# Patient Record
Sex: Male | Born: 1955 | Race: Black or African American | Hispanic: No | Marital: Married | Smoking: Never smoker
Health system: Southern US, Community
[De-identification: ages and names within clinical notes are randomized; demographics above are authoritative.]

## PROBLEM LIST (undated history)

## (undated) ENCOUNTER — Emergency Department (HOSPITAL_BASED_OUTPATIENT_CLINIC_OR_DEPARTMENT_OTHER): Admission: EM | Payer: Managed Care, Other (non HMO) | Source: Home / Self Care

## (undated) DIAGNOSIS — K4090 Unilateral inguinal hernia, without obstruction or gangrene, not specified as recurrent: Secondary | ICD-10-CM

## (undated) DIAGNOSIS — I10 Essential (primary) hypertension: Secondary | ICD-10-CM

## (undated) HISTORY — DX: Essential (primary) hypertension: I10

## (undated) HISTORY — DX: Unilateral inguinal hernia, without obstruction or gangrene, not specified as recurrent: K40.90

---
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
## 2006-08-30 ENCOUNTER — Encounter: Admission: RE | Admit: 2006-08-30 | Discharge: 2006-08-30 | Payer: Self-pay | Admitting: Internal Medicine

## 2009-03-16 ENCOUNTER — Encounter: Admission: RE | Admit: 2009-03-16 | Discharge: 2009-03-16 | Payer: Self-pay | Admitting: Internal Medicine

## 2009-05-28 ENCOUNTER — Emergency Department (HOSPITAL_BASED_OUTPATIENT_CLINIC_OR_DEPARTMENT_OTHER): Admission: EM | Admit: 2009-05-28 | Discharge: 2009-05-28 | Payer: Self-pay | Admitting: Emergency Medicine

## 2010-08-14 ENCOUNTER — Encounter: Payer: Self-pay | Admitting: Internal Medicine

## 2011-12-06 ENCOUNTER — Other Ambulatory Visit: Payer: Self-pay | Admitting: Internal Medicine

## 2011-12-06 ENCOUNTER — Ambulatory Visit
Admission: RE | Admit: 2011-12-06 | Discharge: 2011-12-06 | Disposition: A | Discharge status: Home / Self Care | Payer: Managed Care, Other (non HMO) | Source: Ambulatory Visit | Attending: Internal Medicine | Admitting: Internal Medicine

## 2011-12-06 DIAGNOSIS — R0789 Other chest pain: Secondary | ICD-10-CM

## 2012-07-10 ENCOUNTER — Encounter: Payer: Self-pay | Admitting: Interventional Cardiology

## 2013-02-03 ENCOUNTER — Encounter: Payer: Self-pay | Admitting: Interventional Cardiology

## 2013-02-11 ENCOUNTER — Encounter (INDEPENDENT_AMBULATORY_CARE_PROVIDER_SITE_OTHER): Payer: Self-pay

## 2013-03-12 ENCOUNTER — Ambulatory Visit (INDEPENDENT_AMBULATORY_CARE_PROVIDER_SITE_OTHER): Payer: Commercial Indemnity | Admitting: Surgery

## 2013-03-12 ENCOUNTER — Encounter (INDEPENDENT_AMBULATORY_CARE_PROVIDER_SITE_OTHER): Payer: Self-pay | Admitting: Surgery

## 2013-03-12 VITALS — BP 124/78 | HR 64 | Temp 97.8°F | Resp 14 | Ht 69.0 in | Wt 179.0 lb

## 2013-03-12 DIAGNOSIS — K4090 Unilateral inguinal hernia, without obstruction or gangrene, not specified as recurrent: Secondary | ICD-10-CM | POA: Insufficient documentation

## 2013-03-12 NOTE — Progress Notes (Signed)
Re:   Devin Lynn DOB:   May 04, 1956 MRN:   [id number]  ASSESSMENT AND PLAN: 1.  Left inguinal hernia  I discussed the indications and complications of hernia surgery with the patient.  I discussed both the laparoscopic and open approach to hernia repair..  The potential risks of hernia surgery include, but are not limited to, bleeding, infection, open surgery, nerve injury, and recurrence of the hernia.  I provided the patient literature about hernia surgery.  The hernia is a little abnormal location - so to think about a femoral hernia.  His hernia is very small and almost asymptomatic.  He could watch it or go ahead and get it fixed.  He wants to see me back in 6 months to check the hernia.  If he decides to get it fixed, he will call back in the mean time.  He knows if it enlarges or causes increasing pain, he should contact us.  2.  Hypertension  Chief Complaint  Patient presents with  . New Evaluation    eval [facility]   REFERRING PHYSICIAN: MOREIRA,ROY, MD  HISTORY OF PRESENT ILLNESS: Devin Lynn is a 57 y.o. (DOB: 07-Mar-1956)  AA  male whose primary care physician is MOREIRA,ROY, MD and comes to me today for a left inguinal hernia.  The patient has noticed a bulge in his left groin for about 7 weeks.  He says that he notices a "poofy" sensation about once a week, so the hernia is barely bothering him.  He says that at time, he has "flutters beneath his umbilicus".   He has had no prior abdominal surgery or hernia repair.  He has no history of stomach disease.  No history of liver disease.  No history of gall bladder disease.  No history of pancreas disease.  No history of colon disease.  He had a colonoscopy about 2006 in [place] (unknown GI doctor).   Past Medical History  Diagnosis Date  . Hypertension     Past Surgical History  Procedure Laterality Date  . Circumcision  1974    Current Outpatient Prescriptions  Medication Sig Dispense Refill  . aspirin 81 MG tablet Take 81 mg  by mouth daily.      Marland Kitchen lisinopril (PRINIVIL,ZESTRIL) 10 MG tablet Take 10 mg by mouth daily.       No current facility-administered medications for this visit.     Not on File  REVIEW OF SYSTEMS: Skin:  No history of rash.  No history of abnormal moles. Infection:  No history of hepatitis or HIV.  No history of MRSA. Neurologic:  No history of stroke.  No history of seizure.  No history of headaches. Cardiac:  Hypertension x 1 year. Pulmonary:  Does not smoke cigarettes.  No asthma or bronchitis.  No OSA/CPAP.  Endocrine:  No diabetes. No thyroid disease. Gastrointestinal:  See HPI. Urologic:  No history of kidney stones.  No history of bladder infections. Musculoskeletal:  No history of joint or back disease. Hematologic:  No bleeding disorder.  No history of anemia.  Not anticoagulated. Psycho-social:  The patient is oriented.   The patient has no obvious psychologic or social impairment to understanding our conversation and plan.  SOCIAL and FAMILY HISTORY: Married. But he did not want his wife in the room.  He said that she would say go ahead and get it fixed. 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  PHYSICAL EXAM: BP 124/78  Pulse 64  Temp(Src) 97.8 F (36.6 C) (Temporal)  Resp 14  Ht 5\' 9"  (1.753 m)  Wt 179 lb (81.194 kg)  BMI 26.42 kg/m2  General: AA M who is alert and generally healthy appearing.  He has a beard. HEENT: Normal. Pupils equal. Neck: Supple. No mass.  No thyroid mass. Lymph Nodes:  No supraclavicular or cervical nodes. Lungs: Clear to auscultation and symmetric breath sounds. Heart:  RRR. No murmur or rub.  Abdomen: Soft. No mass. No tenderness. Normal bowel sounds.  No abdominal scars.  In a standing position, I can feel a small hernia in his right groin.  It is a little hard for me to tell whether this is inguinal or femoral in location, though I favor inguinal. The hernia reduces when he lays down and I have trouble  feeling it.   I do not feel a hernia on the right side. Rectal: Not done. Extremities:  Good strength and ROM  in upper and lower extremities. Neurologic:  Grossly intact to motor and sensory function. Psychiatric: Has normal mood and affect. Behavior is normal.   DATA REVIEWED: Notes from Dr. Ludwig Clarks.  Ovidio Kin, MD,  [facility] [address] [place]  [address]   [place], [place]    [address] Phone:  [phone] FAX:  [phone]

## 2013-04-10 ENCOUNTER — Encounter: Payer: Self-pay | Admitting: Interventional Cardiology

## 2013-04-10 ENCOUNTER — Encounter: Payer: Self-pay | Admitting: *Deleted

## 2013-04-10 DIAGNOSIS — I10 Essential (primary) hypertension: Secondary | ICD-10-CM | POA: Insufficient documentation

## 2013-04-22 ENCOUNTER — Ambulatory Visit (INDEPENDENT_AMBULATORY_CARE_PROVIDER_SITE_OTHER): Payer: Managed Care, Other (non HMO) | Admitting: Interventional Cardiology

## 2013-04-22 ENCOUNTER — Encounter: Payer: Self-pay | Admitting: Interventional Cardiology

## 2013-04-22 VITALS — BP 120/90 | HR 80 | Ht 69.0 in | Wt 171.0 lb

## 2013-04-22 DIAGNOSIS — R079 Chest pain, unspecified: Secondary | ICD-10-CM

## 2013-04-22 DIAGNOSIS — I10 Essential (primary) hypertension: Secondary | ICD-10-CM

## 2013-04-22 NOTE — Patient Instructions (Addendum)
NO CHANGES WERE MADE TODAY  Your physician recommends that you schedule a follow-up appointment in: AS NEEDED

## 2013-04-22 NOTE — Progress Notes (Signed)
  [address] [place], [place]  [address] Phone: [phone] Fax:  [phone]  Date:  04/22/2013   ID:  Haward Pope, DOB [birth year], MRN [id number]  PCP:  Ralene Ok, MD     History of Present Illness: Devin Lynn is a 57 y.o. male is here for evaluation of chest discomfort and left leg pain occurred. On one occasion 4 weeks ago. He was running when this occurred. It lasted less than 5 seconds. It frightened him. He has not had palpitations, orthopnea, PND, chest tightness, lower extremity edema, or other cardiac complaints. He is very active and has no limitations. It has not recurred since episode several weeks ago.   Wt Readings from Last 3 Encounters:  03/12/13 179 lb (81.194 kg)     Past Medical History  Diagnosis Date  . Hypertension   . Inguinal hernia, left     Current Outpatient Prescriptions  Medication Sig Dispense Refill  . aspirin 81 MG tablet Take 81 mg by mouth daily.      Marland Kitchen lisinopril (PRINIVIL,ZESTRIL) 10 MG tablet Take 10 mg by mouth daily.       No current facility-administered medications for this visit.    Allergies:   Not on File  Social History:  The patient  reports that he has never smoked. He has never used smokeless tobacco. He reports that he does not drink alcohol or use illicit drugs.   ROS:  Please see the history of present illness.   There are no neurological complaints. He denies wheezing, dyspnea, and asthma. He denies lower extremity swelling. No history of GI bleeding, or intestinal problems. There is no arthritis or connective tissue disorder.   All other systems reviewed and negative.   PHYSICAL EXAM: VS: BP 120/90  Pulse 80  Ht 5\' 9"  (1.753 m)  Wt 171 lb (77.565 kg)  BMI 25.24 kg/m2 Well nourished, well developed, in no acute distress HEENT: normal Neck: no JVD or carotid bruits are noted. Cardiac:  normal S1, S2; RRR; no murmur. Soft S4 gallop is audible.  Lungs:  clear to auscultation bilaterally, no wheezing,  rhonchi or rales Abd: soft, nontender, no hepatomegaly. No bruits heard.  Ext: no edema. Femoral pulses are 2+ and symmetric as are the popliteal and posterior tibial.  Skin: warm and dry Neuro:  CNs 2-12 intact, no focal abnormalities noted  EKG:   left axis deviation. Normal sinus rhythm, no an EKG performed to Dr. Jacqulyn Bath office on 02/03/13 to      ASSESSMENT AND PLAN:  1. Chest pain, felt to represent nonischemic discomfort. Likely musculoskeletal or neurogenic 2. Left leg pain, transient, also felt to be musculoskeletal and neurogenic. No evidence of PVD 3.  Hypertension under excellent control.  The patient's symptoms do not reflect cardiovascular disease. I recommended continued exercise, weight maintenance, close followup of lipids, which are under good condition at this time. I do not feel any functional testing is indicated at this time. I reassured the patient. I will be happy to see him again in the future if any issues arise.   Signed, Alanda Amass Leia Alf, MD 04/22/2013 9:07 AM

## 2013-07-26 ENCOUNTER — Ambulatory Visit (HOSPITAL_BASED_OUTPATIENT_CLINIC_OR_DEPARTMENT_OTHER)
Admission: EM | Admit: 2013-07-26 | Discharge: 2013-07-26 | Disposition: A | Discharge status: Home / Self Care | Payer: Managed Care, Other (non HMO) | Attending: Emergency Medicine | Admitting: Emergency Medicine

## 2013-07-26 ENCOUNTER — Emergency Department (HOSPITAL_BASED_OUTPATIENT_CLINIC_OR_DEPARTMENT_OTHER)
Admission: EM | Admit: 2013-07-26 | Discharge: 2013-07-26 | Disposition: A | Discharge status: Home / Self Care | Payer: Managed Care, Other (non HMO) | Attending: Emergency Medicine | Admitting: Emergency Medicine

## 2013-07-26 ENCOUNTER — Encounter (HOSPITAL_BASED_OUTPATIENT_CLINIC_OR_DEPARTMENT_OTHER): Payer: Self-pay | Admitting: Emergency Medicine

## 2013-07-26 ENCOUNTER — Emergency Department (HOSPITAL_BASED_OUTPATIENT_CLINIC_OR_DEPARTMENT_OTHER): Payer: Managed Care, Other (non HMO)

## 2013-07-26 ENCOUNTER — Telehealth (HOSPITAL_BASED_OUTPATIENT_CLINIC_OR_DEPARTMENT_OTHER): Payer: Self-pay | Admitting: Emergency Medicine

## 2013-07-26 DIAGNOSIS — R0789 Other chest pain: Secondary | ICD-10-CM | POA: Insufficient documentation

## 2013-07-26 DIAGNOSIS — I10 Essential (primary) hypertension: Secondary | ICD-10-CM | POA: Insufficient documentation

## 2013-07-26 DIAGNOSIS — J209 Acute bronchitis, unspecified: Secondary | ICD-10-CM | POA: Insufficient documentation

## 2013-07-26 DIAGNOSIS — Z7982 Long term (current) use of aspirin: Secondary | ICD-10-CM | POA: Insufficient documentation

## 2013-07-26 DIAGNOSIS — J189 Pneumonia, unspecified organism: Secondary | ICD-10-CM

## 2013-07-26 DIAGNOSIS — J3489 Other specified disorders of nose and nasal sinuses: Secondary | ICD-10-CM | POA: Insufficient documentation

## 2013-07-26 DIAGNOSIS — R05 Cough: Secondary | ICD-10-CM | POA: Insufficient documentation

## 2013-07-26 DIAGNOSIS — R9389 Abnormal findings on diagnostic imaging of other specified body structures: Secondary | ICD-10-CM | POA: Insufficient documentation

## 2013-07-26 DIAGNOSIS — K4090 Unilateral inguinal hernia, without obstruction or gangrene, not specified as recurrent: Secondary | ICD-10-CM | POA: Insufficient documentation

## 2013-07-26 DIAGNOSIS — R059 Cough, unspecified: Secondary | ICD-10-CM | POA: Insufficient documentation

## 2013-07-26 DIAGNOSIS — Z8719 Personal history of other diseases of the digestive system: Secondary | ICD-10-CM | POA: Insufficient documentation

## 2013-07-26 DIAGNOSIS — Z79899 Other long term (current) drug therapy: Secondary | ICD-10-CM | POA: Insufficient documentation

## 2013-07-26 LAB — BASIC METABOLIC PANEL
BUN: 10 mg/dL (ref 6–23)
CO2: 29 meq/L (ref 19–32)
CREATININE: 1 mg/dL (ref 0.50–1.35)
Calcium: 9.4 mg/dL (ref 8.4–10.5)
Chloride: 101 mEq/L (ref 96–112)
GFR calc Af Amer: >90 mL/min (ref >90)
GFR, EST NON AFRICAN AMERICAN: 81 mL/min — AB (ref >90)
GLUCOSE: 118 mg/dL — AB (ref 70–99)
Potassium: 4 mEq/L (ref 3.7–5.3)
SODIUM: 141 meq/L (ref 137–147)

## 2013-07-26 MED ORDER — ALBUTEROL SULFATE HFA 108 (90 BASE) MCG/ACT IN AERS
2.0000 | INHALATION_SPRAY | RESPIRATORY_TRACT | Status: DC | PRN
  Filled 2013-07-26: qty 6.7

## 2013-07-26 MED ORDER — SODIUM CHLORIDE 0.9 % IV BOLUS (SEPSIS)
1000.0000 mL | Freq: Once | INTRAVENOUS | Status: AC
  Administered 2013-07-26: 1000 mL via INTRAVENOUS

## 2013-07-26 MED ORDER — LEVOFLOXACIN 500 MG PO TABS
500.0000 mg | ORAL_TABLET | Freq: Once | ORAL | Status: AC
  Administered 2013-07-26: 500 mg via ORAL
  Filled 2013-07-26: qty 1

## 2013-07-26 MED ORDER — HYDROCOD POLST-CHLORPHEN POLST 10-8 MG/5ML PO LQCR
5.0000 mL | Freq: Once | ORAL | Status: AC
  Administered 2013-07-26: 5 mL via ORAL
  Filled 2013-07-26: qty 5

## 2013-07-26 MED ORDER — IOHEXOL 350 MG/ML SOLN
100.0000 mL | Freq: Once | INTRAVENOUS | Status: AC | PRN
  Administered 2013-07-26: 100 mL via INTRAVENOUS

## 2013-07-26 MED ORDER — LEVOFLOXACIN 500 MG PO TABS: 500.0000 mg | ORAL_TABLET | Freq: Every day | ORAL | Status: DC

## 2013-07-26 MED ORDER — HYDROCOD POLST-CHLORPHEN POLST 10-8 MG/5ML PO LQCR: 5.0000 mL | Freq: Two times a day (BID) | ORAL | Status: DC | PRN

## 2013-07-26 NOTE — Discharge Instructions (Signed)
Acute Bronchitis Bronchitis is inflammation of the airways that extend from the windpipe into the lungs (bronchi). The inflammation often causes mucus to develop. This leads to a cough, which is the most common symptom of bronchitis.  In acute bronchitis, the condition usually develops suddenly and goes away over time, usually in a couple weeks. Smoking, allergies, and asthma can make bronchitis worse. Repeated episodes of bronchitis may cause further lung problems.  CAUSES Acute bronchitis is most often caused by the same virus that causes a cold. The virus can spread from person to person (contagious).  SIGNS AND SYMPTOMS   Cough.   Fever.   Coughing up mucus.   Body aches.   Chest congestion.   Chills.   Shortness of breath.   Sore throat.  DIAGNOSIS  Acute bronchitis is usually diagnosed through a physical exam. Tests, such as chest X-rays, are sometimes done to rule out other conditions.  TREATMENT  Acute bronchitis usually goes away in a couple weeks. Often times, no medical treatment is necessary. Medicines are sometimes given for relief of fever or cough. Antibiotics are usually not needed but may be prescribed in certain situations. In some cases, an inhaler may be recommended to help reduce shortness of breath and control the cough. A cool mist vaporizer may also be used to help thin bronchial secretions and make it easier to clear the chest.  HOME CARE INSTRUCTIONS  Get plenty of rest.   Drink enough fluids to keep your urine clear or pale yellow (unless you have a medical condition that requires fluid restriction). Increasing fluids may help thin your secretions and will prevent dehydration.   Only take over-the-counter or prescription medicines as directed by your health care provider.   Avoid smoking and secondhand smoke. Exposure to cigarette smoke or irritating chemicals will make bronchitis worse. If you are a smoker, consider using nicotine gum or skin  patches to help control withdrawal symptoms. Quitting smoking will help your lungs heal faster.   Reduce the chances of another bout of acute bronchitis by washing your hands frequently, avoiding people with cold symptoms, and trying not to touch your hands to your mouth, nose, or eyes.   Follow up with your health care provider as directed.  SEEK MEDICAL CARE IF: Your symptoms do not improve after 1 week of treatment.  SEEK IMMEDIATE MEDICAL CARE IF:  You develop an increased fever or chills.   You have chest pain.   You have severe shortness of breath.  You have bloody sputum.   You develop dehydration.  You develop fainting.  You develop repeated vomiting.  You develop a severe headache. MAKE SURE YOU:   Understand these instructions.  Will watch your condition.  Will get help right away if you are not doing well or get worse. Document Released: 08/16/2004 Document Revised: 03/11/2013 Document Reviewed: 12/30/2012 Exit[employer] Patient Information 2014 [employer].  

## 2013-07-26 NOTE — Patient Instructions (Signed)
Patient instructed in use of Albuterol MDI with spacer.  Patient with good understanding and return explanation of use.  MD's ordered dosing instructions also given to patient.  Patient deferred using/demonstrating use at this time, states he is comfortable in its use.  Patient had no further questions. 

## 2013-07-26 NOTE — ED Notes (Signed)
Cough x9 days.  Given Cefdinir 31946m,g bid by pmd.  Finished it today.  Pt continues to cough and feel tight in chest.  Has not had cxr.

## 2013-07-26 NOTE — Telephone Encounter (Signed)
attempt to contact regarding follow-up from ED visit. Left message to call flow managers #

## 2013-07-26 NOTE — Discharge Instructions (Signed)
As discussed, it is important that you follow up as soon as possible with your physician for continued management of your condition. ° °If you develop any new, or concerning changes in your condition, please return to the emergency department immediately. ° °

## 2013-07-26 NOTE — ED Provider Notes (Signed)
CSN: [id number]     Arrival date & time 07/26/13  0344 History   First MD Initiated Contact with Patient 07/26/13 0413     Chief Complaint  Patient presents with  . Cough   (Consider location/radiation/quality/duration/timing/severity/associated sxs/prior Treatment) HPI This is a 58 year old male with a 9 day history of cough. He was seen by his primary care physician and placed on a course of Cefdinir which he finished yesterday evening. He has also been taking Mucinex. Despite this treatment he has a persistent cough. The cough is associated with some tightness only when he coughs. He does not have chest pain. He has not had shortness of breath. He does not have dyspnea on exertion. He had low-grade fevers early in the course of his illness. He is having some nasal and sinus congestion. He denies other symptoms. He stopped taking his lisinopril when this illness began.  Past Medical History  Diagnosis Date  . Hypertension   . Inguinal hernia, left    Past Surgical History  Procedure Laterality Date  . Circumcision  1974   Family History  Problem Relation Age of Onset  . Diabetes Mellitus II Mother   . Diabetes Mellitus II Father   . Diabetes Mellitus II Sister   . HIV/AIDS Brother    History  Substance Use Topics  . Smoking status: Never Smoker   . Smokeless tobacco: Never Used  . Alcohol Use: No    Review of Systems  All other systems reviewed and are negative.    Allergies  Review of patient's allergies indicates no known allergies.  Home Medications   Current Outpatient Rx  Name  Route  Sig  Dispense  Refill  . aspirin 81 MG tablet   Oral   Take 81 mg by mouth daily.         . chlorpheniramine-HYDROcodone (TUSSIONEX PENNKINETIC ER) 10-8 MG/5ML LQCR   Oral   Take 5 mLs by mouth every 12 (twelve) hours as needed for cough.   70 mL   0   . lisinopril (PRINIVIL,ZESTRIL) 10 MG tablet   Oral   Take 10 mg by mouth daily.          BP 131/81  Pulse 68   Temp(Src) 98.2 F (36.8 C) (Oral)  Resp 16  Ht 5\' 9"  (1.753 m)  Wt 172 lb 8 oz (78.245 kg)  BMI 25.46 kg/m2  SpO2 98%  Physical Exam General: Well-developed, well-nourished male in no acute distress; appearance consistent with age of record HENT: normocephalic; atraumatic Eyes: pupils equal, round and reactive to light; extraocular muscles intact; arcus senilis bilaterally Neck: supple Heart: regular rate and rhythm; no murmurs, rubs or gallops Lungs: clear to auscultation bilaterally; dry cough Abdomen: soft; nondistended Extremities: No deformity; full range of motion Neurologic: Awake, alert and oriented; motor function intact in all extremities and symmetric; no facial droop Skin: Warm and dry Psychiatric: Normal mood and affect    ED Course  Procedures (including critical care time)    MDM  Nursing notes and vitals signs, including pulse oximetry, reviewed.  Summary of this visit's results, reviewed by myself:  Imaging Studies: Dg Chest 2 View  07/26/2013   CLINICAL DATA:  Cough and chest tightness  EXAM: CHEST  2 VIEW  COMPARISON:  Prior radiograph from 12/06/2011  FINDINGS: The cardiac and mediastinal silhouettes are within normal limits.  Lungs are normally inflated. There is an irregular opacity measuring approximately 3.1 cm located within the central right lung. There is suggestive  of a of possible central cavitation. No internal air-fluid level seen. This is not seen on prior radiograph from 12/06/2011.  No other focal infiltrate identified. No pulmonary edema or pleural effusion. No pneumothorax.  Visualized soft tissues and osseous structures are within normal limits.  IMPRESSION: 3.1 cm opacity within the mid right lung with suggestion of central cavitation. This finding is new as compared to most recent chest radiograph from 12/06/2011. Further evaluation with contrast-enhanced CT is recommended.   Electronically Signed   By: Rise MuBenjamin  McClintock M.D.   On:  07/26/2013 05:31   5:54 AM Due to significant delay in getting films read by the patient was discharged before the final x-ray reading was available from radiologist. Given the findings of the flow manager was contacted and will call the patient advising him of the chest x-ray findings and the need to have a followup CT. He may either follow up back in the ED or follow up through his primary care physician the    Hanley SeamenJohn L Tereka Thorley, MD 07/26/13 [phone]

## 2013-07-26 NOTE — ED Notes (Signed)
MD at bedside. 

## 2013-07-26 NOTE — ED Notes (Signed)
Patient was told to come back to ED for Ct-Scan of chest

## 2013-07-26 NOTE — ED Notes (Signed)
MD at bedside discussing test results and plan of care for dispo.  

## 2013-07-26 NOTE — ED Provider Notes (Signed)
CSN: [id number]     Arrival date & time 07/26/13  1459 History  This chart was scribed for Gerhard Munchobert Andon Villard, MD by Leone PayorSonum Patel, ED Scribe. This patient was seen in room MH07/MH07 and the patient's care was started 4:16 PM.    Chief Complaint  Patient presents with  . Follow-up    The history is provided by the patient. No language interpreter was used.    HPI Comments: Devin Lynn is a 58 y.o. male who presents to the Emergency Department requesting follow up for a visit from yesterday. He was seen yesterday for the same symptoms and had a CXR performed at that time. Pt states he was called and asked to return due to abnormalities on the CXR reading. He reports having 9 days of a cough with associated chest tightness. He also reports having rhinorrhea and fever when his symptoms initially began. He states the cough has been persistent and has not changed with taking Mucinex and antibiotics prescribed by his PCP.  He also reports having a hoarse voice that began about 4 days ago. He describes this cough as barking.   Past Medical History  Diagnosis Date  . Hypertension   . Inguinal hernia, left    Past Surgical History  Procedure Laterality Date  . Circumcision  1974   Family History  Problem Relation Age of Onset  . Diabetes Mellitus II Mother   . Diabetes Mellitus II Father   . Diabetes Mellitus II Sister   . HIV/AIDS Brother    History  Substance Use Topics  . Smoking status: Never Smoker   . Smokeless tobacco: Never Used  . Alcohol Use: No    Review of Systems  Constitutional:       Per HPI, otherwise negative  HENT: Positive for rhinorrhea and voice change.        Per HPI, otherwise negative  Respiratory: Positive for cough and chest tightness.        Per HPI, otherwise negative  Cardiovascular:       Per HPI, otherwise negative  Gastrointestinal: Negative for vomiting.  Endocrine:       Negative aside from HPI  Genitourinary:       Neg aside from HPI    Musculoskeletal:       Per HPI, otherwise negative  Skin: Negative.   Neurological: Negative for syncope.    Allergies  Review of patient's allergies indicates no known allergies.  Home Medications   Current Outpatient Rx  Name  Route  Sig  Dispense  Refill  . aspirin 81 MG tablet   Oral   Take 81 mg by mouth daily.         . chlorpheniramine-HYDROcodone (TUSSIONEX PENNKINETIC ER) 10-8 MG/5ML LQCR   Oral   Take 5 mLs by mouth every 12 (twelve) hours as needed for cough.   70 mL   0   . lisinopril (PRINIVIL,ZESTRIL) 10 MG tablet   Oral   Take 10 mg by mouth daily.          BP 141/78  Pulse 84  Temp(Src) 99.3 F (37.4 C) (Oral)  Wt 172 lb (78.019 kg)  SpO2 97% Physical Exam  Nursing note and vitals reviewed. Constitutional: He is oriented to person, place, and time. He appears well-developed. No distress.  HENT:  Head: Normocephalic and atraumatic.  Eyes: Conjunctivae and EOM are normal.  Cardiovascular: Normal rate, regular rhythm and normal heart sounds.   Pulmonary/Chest: Effort normal. No stridor. No respiratory distress.  Abdominal: He exhibits no distension.  Musculoskeletal: He exhibits no edema.  Neurological: He is alert and oriented to person, place, and time.  Skin: Skin is warm and dry.  Psychiatric: He has a normal mood and affect.    ED Course  Procedures   DIAGNOSTIC STUDIES: Oxygen Saturation is 97% on RA, adequate by my interpretation.    COORDINATION OF CARE: 4:47 PM Will order chest CT and BMP. Discussed treatment plan with pt at bedside and pt agreed to plan.  5:57 PM Discussed pneumonia findings on the chest CT scan.      Labs Review Labs Reviewed  BASIC METABOLIC PANEL - Abnormal; Notable for the following:    Glucose, Bld 118 (*)    GFR calc non Af Amer 81 (*)    All other components within normal limits   Imaging Review Dg Chest 2 View  07/26/2013   CLINICAL DATA:  Cough and chest tightness  EXAM: CHEST  2 VIEW   COMPARISON:  Prior radiograph from 12/06/2011  FINDINGS: The cardiac and mediastinal silhouettes are within normal limits.  Lungs are normally inflated. There is an irregular opacity measuring approximately 3.1 cm located within the central right lung. There is suggestive of a of possible central cavitation. No internal air-fluid level seen. This is not seen on prior radiograph from 12/06/2011.  No other focal infiltrate identified. No pulmonary edema or pleural effusion. No pneumothorax.  Visualized soft tissues and osseous structures are within normal limits.  IMPRESSION: 3.1 cm opacity within the mid right lung with suggestion of central cavitation. This finding is new as compared to most recent chest radiograph from 12/06/2011. Further evaluation with contrast-enhanced CT is recommended.   Electronically Signed   By: Rise Mu M.D.   On: 07/26/2013 05:31   Ct Chest W Contrast  07/26/2013   CLINICAL DATA:  Question cavitary lesion on recent chest x-ray.  EXAM: CT CHEST WITH CONTRAST  TECHNIQUE: Multidetector CT imaging of the chest was performed during intravenous contrast administration.  CONTRAST:  OMNIPAQUE IOHEXOL 350 MG/ML SOLN  COMPARISON:  Chest x-ray from today and 12/06/2011  FINDINGS: Lungs are adequately inflated demonstrate a patchy bilateral multilobar nodular airspace process without cavitation. Findings likely due to infection. There is no evidence of effusion. Heart is normal in size. There is no mediastinal, hilar or axillary adenopathy. Remaining mediastinal structures are within normal.  Images through the upper abdomen are unremarkable. Remainder the exam is unremarkable.  IMPRESSION: Patchy multilobar bilateral nodular airspace process without cavitation likely a multilobar pneumonia.   Electronically Signed   By: Elberta Fortis M.D.   On: 07/26/2013 17:18    EKG Interpretation   None       MDM  Patient presents after initial evaluation earlier today with truncated  a full evaluation secondary to radiographic delays, now for completion of that evaluation.  On exam patient is awake, alert, afebrile, he dynamically stable.  The patient's CT scan, which I reviewed with him and his wife in the room demonstrates multilobar pneumonia.  Patient is stable for discharge with outpatient followup.  Gerhard Munch, MD 07/26/13 [phone]

## 2013-07-26 NOTE — ED Notes (Signed)
Patient instructed in use of Albuterol MDI with spacer.  Patient with good understanding and return explanation of use.  MD's ordered dosing instructions also given to patient.  Patient deferred using/demonstrating use at this time, states he is comfortable in its use.  Patient had no further questions.

## 2013-07-26 NOTE — ED Notes (Signed)
RX X 1 GIVEN FOR LEVAQUIN

## 2014-01-06 ENCOUNTER — Encounter (INDEPENDENT_AMBULATORY_CARE_PROVIDER_SITE_OTHER): Payer: Self-pay | Admitting: Surgery

## 2014-03-08 ENCOUNTER — Telehealth: Payer: Self-pay | Admitting: Interventional Cardiology

## 2014-03-08 DIAGNOSIS — R0789 Other chest pain: Secondary | ICD-10-CM

## 2014-03-08 NOTE — Telephone Encounter (Signed)
Returned call to patient phone rings busy. 

## 2014-03-08 NOTE — Telephone Encounter (Signed)
New message           Pt would like to have a stress test / Can you give pt a call

## 2014-03-09 NOTE — Telephone Encounter (Signed)
SPOKE WITH PT'S  WIFE  IS  CALLING REQUESTING  PT  HAVE   STRESS TEST PER PT'S WIFE  HAS  A COUPLE OF  EPISODES OF  CHEST PAIN WOULD  LIKE TO  BE  TESTED  TO MAKE  SURE HEART IS  GETTING GOOD  BLOOD  FLOW  WILL FORWARD  TO  DR Katrinka BlazingSMITH  FOR  REVIEW AND  RECOMMENDATIONS./CY

## 2014-03-12 NOTE — Telephone Encounter (Signed)
Stress cardiolite

## 2014-03-16 NOTE — Telephone Encounter (Signed)
pt wife aware of Dr..Smith's recommendations.pt to be sch for a Stress cardiolite.pt wife given verbal preprocedure instructions.pt wife adv a Systems developer from our office will call pt to schedule myoview pt wife verbalized understanding.

## 2014-04-06 ENCOUNTER — Ambulatory Visit
Admission: RE | Admit: 2014-04-06 | Discharge: 2014-04-06 | Disposition: A | Discharge status: Home / Self Care | Payer: Managed Care, Other (non HMO) | Source: Ambulatory Visit | Attending: Internal Medicine | Admitting: Internal Medicine

## 2014-04-06 ENCOUNTER — Other Ambulatory Visit: Payer: Self-pay | Admitting: Internal Medicine

## 2014-04-06 ENCOUNTER — Ambulatory Visit (HOSPITAL_COMMUNITY): Payer: Managed Care, Other (non HMO) | Attending: Cardiology | Admitting: Radiology

## 2014-04-06 VITALS — BP 149/86 | Ht 69.0 in | Wt 192.0 lb

## 2014-04-06 DIAGNOSIS — I10 Essential (primary) hypertension: Secondary | ICD-10-CM | POA: Diagnosis not present

## 2014-04-06 DIAGNOSIS — M25561 Pain in right knee: Secondary | ICD-10-CM

## 2014-04-06 DIAGNOSIS — J189 Pneumonia, unspecified organism: Secondary | ICD-10-CM

## 2014-04-06 DIAGNOSIS — M25562 Pain in left knee: Principal | ICD-10-CM

## 2014-04-06 DIAGNOSIS — R0989 Other specified symptoms and signs involving the circulatory and respiratory systems: Secondary | ICD-10-CM | POA: Diagnosis present

## 2014-04-06 DIAGNOSIS — R0789 Other chest pain: Secondary | ICD-10-CM

## 2014-04-06 DIAGNOSIS — R079 Chest pain, unspecified: Secondary | ICD-10-CM | POA: Diagnosis present

## 2014-04-06 DIAGNOSIS — R0602 Shortness of breath: Secondary | ICD-10-CM

## 2014-04-06 DIAGNOSIS — R0609 Other forms of dyspnea: Secondary | ICD-10-CM | POA: Insufficient documentation

## 2014-04-06 MED ORDER — TECHNETIUM TC 99M SESTAMIBI GENERIC - CARDIOLITE
33.0000 | Freq: Once | INTRAVENOUS | Status: AC | PRN
  Administered 2014-04-06: 33 via INTRAVENOUS

## 2014-04-06 MED ORDER — TECHNETIUM TC 99M SESTAMIBI GENERIC - CARDIOLITE
11.0000 | Freq: Once | INTRAVENOUS | Status: AC | PRN
  Administered 2014-04-06: 11 via INTRAVENOUS

## 2014-04-06 NOTE — Progress Notes (Signed)
[facility] SITE 3 NUCLEAR MED [address] [place], [place] [address] [phone]    Cardiology Nuclear Med Study  Devin Lynn is a 58 y.o. male     MRN : [id number]     DOB: 23-Apr-1956  Procedure Date: 04/06/2014  Nuclear Med Background Indication for Stress Test:  Evaluation for Ischemia History:  '10 ION:GEXBM Point;normal per patient Cardiac Risk Factors: Hypertension  Symptoms:  Chest Pain (last date of chest discomfort 1 week ago) and DOE   Nuclear Pre-Procedure Caffeine/Decaff Intake:  None> 12 hrs NPO After: 12:00am   Lungs:  clear O2 Sat: 92% on room air. IV 0.9% NS with Angio Cath:  22g  IV Site: R Forearm x 1, tolerated well IV Started by:  Irean Hong, RN  Chest Size (in):  42 Cup Size: n/a  Height:  (1.753 m)  Weight:  192 lb (87.091 kg)  BMI:  Body mass index is 28.34 kg/(m^2). Tech Comments:  Patient took Lisinopril this am. Irean Hong, RN.    Nuclear Med Study 1 or 2 day study: 1 day  Stress Test Type:  Stress  Reading MD: N/A  Order Authorizing Provider:  Verdis Prime, III, MD  Resting Radionuclide: Technetium 74m Sestamibi  Resting Radionuclide Dose: 11.0 mCi   Stress Radionuclide:  Technetium 57m Sestamibi  Stress Radionuclide Dose: 33.0 mCi           Stress Protocol Rest HR: 57 Stress HR: 142  Rest BP: 149/86 Stress BP: 180/90  Exercise Time (min): 10:30 METS: 12.3   Predicted Max HR: 161 bpm % Max HR: 88.2 bpm Rate Pressure Product: 84132   Dose of Adenosine (mg):  n/a Dose of Lexiscan: n/a mg  Dose of Atropine (mg): n/a Dose of Dobutamine: n/a mcg/kg/min (at max HR)  Stress Test Technologist: Nelson Chimes, BS-ES  Nuclear Technologist:  Doyne Keel, CNMT     Rest Procedure:  Myocardial perfusion imaging was performed at rest 45 minutes following the intravenous administration of Technetium 68m Sestamibi. Rest ECG: NSR - Normal EKG  Stress Procedure:  The patient exercised on the treadmill utilizing the Bruce Protocol  for 10:30 minutes. The patient stopped due to fatigue and denied any chest pain.  Technetium 56m Sestamibi was injected at peak exercise and myocardial perfusion imaging was performed after a brief delay. Stress ECG: No significant change from baseline ECG  QPS Raw Data Images:  There is interference from nuclear activity from structures below the diaphragm. This does not affect the ability to read the study. Stress Images:  There is a large area of moderate-severe attenuation along the entire inferior wall.   There is increased uptake in structures below the diaphragm that may be interferring with the images.   Rest Images:  There is a large area of moderate-severe attenuation along the entire inferior wall.   There is increased uptake in structures below the diaphragm that may be interferring with the images.  Subtraction (SDS):  No evidence of ischemia.  There is a large, fixed defect along the entire inferior wall that may be due to a subendocardial  Inferior MI.  Transient Ischemic Dilatation (Normal <1.22):  1.14 Lung/Heart Ratio (Normal <0.45):  0.39  Quantitative Gated Spect Images QGS EDV:  125 ml QGS ESV:  60 ml  Impression Exercise Capacity:  Good exercise capacity. BP Response:  Normal blood pressure response. Clinical Symptoms:  No significant symptoms noted. ECG Impression:  No significant ST segment change suggestive of ischemia. Comparison with  Prior Nuclear Study: No images to compare  Overall Impression:  High risk stress nuclear study .  There is a large, fixed defect of the entire inferior wall that may be due to a subendocardial Inferior MI.  I cannot rule out artifact from uptake in structures below the diaphragm.        .  LV Ejection Fraction: 52%.  LV Wall Motion:  There is mild hypokinesis of the inferior wall compared to the other walls.    Vesta Mixer, Montez Hageman., MD, [facility] 04/06/2014, 5:27 PM [address]  [address] Office [phone] Pager [phone]

## 2014-04-07 ENCOUNTER — Telehealth: Payer: Self-pay

## 2014-04-07 MED ORDER — METOPROLOL SUCCINATE ER 25 MG PO TB24: 25.0000 mg | ORAL_TABLET | Freq: Every day | ORAL | Status: DC

## 2014-04-07 MED ORDER — NITROGLYCERIN 0.4 MG SL SUBL: 0.4000 mg | SUBLINGUAL_TABLET | SUBLINGUAL | Status: DC | PRN

## 2014-04-07 NOTE — Telephone Encounter (Signed)
Follow up ° ° ° ° ° °Returning Lisa's call °

## 2014-04-07 NOTE — Telephone Encounter (Signed)
pt called in with Dditional questions. pt rqst to tal k with Dr.Smith before his appt on Friday. adv him that he is @ [facility] today doing procedure.adv him I will fwd a message  to him to call the pt.pt verbalized understanding.

## 2014-04-07 NOTE — Telephone Encounter (Signed)
Pt wife  aware of myoview results and Dr.Smith's instructions.The stress test is abnormal. Interpretation is that of a high risk study. He needs to be set up for a coronary angiogram (left heart cath) with possible PCI.pt already taking aspirin 81 mg per day,: pt will start Nitroglycerin 0.4 mg prn instructed how to use nitro, and start metoprolol succinate 25 mg daily.Rx sent to pt pharmacy [facility]. Appt with Dr.Smith scheduled for 9/18 .pt wife verbalized understanding.

## 2014-04-07 NOTE — Telephone Encounter (Signed)
Spoke to patient. Will see on Friday. Procedure and risks were discussed in detail.

## 2014-04-07 NOTE — Telephone Encounter (Signed)
called to give pt and pt wife myoview reults and Dr.Smith instructions.lmom on both tel # for pt or pt wife to call back

## 2014-04-07 NOTE — Telephone Encounter (Signed)
Message copied by Jarvis Newcomer on Wed Apr 07, 2014 11:53 AM ------      Message from: Verdis Prime      Created: Wed Apr 07, 2014  9:45 AM       The stress test is abnormal. Interpretation is that of a high risk study. He needs to be set up for a coronary angiogram (left heart cath) with possible PCI. Not allergic start aspirin 81 mg per day,: Nitroglycerin 0.4 mg and explained how to use, and start metoprolol succinate 25 mg daily. Have him come in along with his wife to discuss on Friday and perhaps I can do it early next week. ------

## 2014-04-08 ENCOUNTER — Telehealth: Payer: Self-pay | Admitting: Interventional Cardiology

## 2014-04-08 NOTE — Telephone Encounter (Signed)
Spoke with patient's wife. She understood cath was set up for Wednesday and did not know a time. Instructed that at the appointment with Dr. Katrinka Blazing tomorrow am, they will discuss day and will leave knowing the time and details of instructions for the cath. Wife verbalizes understanding and is appreciative of the return phone call.

## 2014-04-08 NOTE — Telephone Encounter (Signed)
New message    Wife calling asking can cath procedure be move up

## 2014-04-09 ENCOUNTER — Ambulatory Visit (INDEPENDENT_AMBULATORY_CARE_PROVIDER_SITE_OTHER): Payer: Managed Care, Other (non HMO) | Admitting: Interventional Cardiology

## 2014-04-09 ENCOUNTER — Encounter: Payer: Self-pay | Admitting: Interventional Cardiology

## 2014-04-09 VITALS — BP 122/76 | HR 59 | Ht 69.0 in

## 2014-04-09 DIAGNOSIS — R079 Chest pain, unspecified: Secondary | ICD-10-CM

## 2014-04-09 DIAGNOSIS — I10 Essential (primary) hypertension: Secondary | ICD-10-CM

## 2014-04-09 DIAGNOSIS — R9439 Abnormal result of other cardiovascular function study: Secondary | ICD-10-CM | POA: Insufficient documentation

## 2014-04-09 LAB — BASIC METABOLIC PANEL
BUN: 14 mg/dL (ref 6–23)
CO2: 28 meq/L (ref 19–32)
Calcium: 9.3 mg/dL (ref 8.4–10.5)
Chloride: 104 mEq/L (ref 96–112)
Creatinine, Ser: 1.3 mg/dL (ref 0.4–1.5)
GFR: 75.28 mL/min (ref >60.00)
Glucose, Bld: 106 mg/dL — ABNORMAL HIGH (ref 70–99)
Potassium: 4.2 mEq/L (ref 3.5–5.1)
SODIUM: 139 meq/L (ref 135–145)

## 2014-04-09 LAB — CBC WITH DIFFERENTIAL/PLATELET
Basophils Absolute: 0 10*3/uL (ref 0.0–0.1)
Basophils Relative: 0.4 % (ref 0.0–3.0)
Eosinophils Absolute: 0.1 10*3/uL (ref 0.0–0.7)
Eosinophils Relative: 1.3 % (ref 0.0–5.0)
HEMATOCRIT: 45.8 % (ref 39.0–52.0)
Hemoglobin: 15.6 g/dL (ref 13.0–17.0)
Lymphocytes Relative: 30.7 % (ref 12.0–46.0)
Lymphs Abs: 1.6 10*3/uL (ref 0.7–4.0)
MCHC: 34.1 g/dL (ref 30.0–36.0)
MCV: 95.6 fl (ref 78.0–100.0)
MONOS PCT: 10.9 % (ref 3.0–12.0)
Monocytes Absolute: 0.6 10*3/uL (ref 0.1–1.0)
Neutro Abs: 2.9 10*3/uL (ref 1.4–7.7)
Neutrophils Relative %: 56.7 % (ref 43.0–77.0)
Platelets: 290 10*3/uL (ref 150.0–400.0)
RBC: 4.79 Mil/uL (ref 4.22–5.81)
RDW: 11.6 % (ref 11.5–15.5)
WBC: 5.2 10*3/uL (ref 4.0–10.5)

## 2014-04-09 LAB — PROTIME-INR
INR: 1.1 ratio — ABNORMAL HIGH (ref 0.8–1.0)
Prothrombin Time: 11.7 s (ref 9.6–13.1)

## 2014-04-09 NOTE — Patient Instructions (Signed)
Your physician recommends that you continue on your current medications as directed. Please refer to the Current Medication list given to you today.  Lab Today: Bmet,Cbc, Pt/Inr  Your physician has requested that you have a cardiac catheterization. Cardiac catheterization is used to diagnose and/or treat various heart conditions. Doctors may recommend this procedure for a number of different reasons. The most common reason is to evaluate chest pain. Chest pain can be a symptom of coronary artery disease (CAD), and cardiac catheterization can show whether plaque is narrowing or blocking your heart's arteries. This procedure is also used to evaluate the valves, as well as measure the blood flow and oxygen levels in different parts of your heart. For further information please visit www.cardiosmart.org. Please follow instruction sheet, as given.   

## 2014-04-09 NOTE — Progress Notes (Signed)
Patient ID: Devin Lynn, male   DOB: 04/17/1956, 58 y.o.   MRN: [id number]   Date: 04/09/2014 ID: Devin Lynn, DOB 07/01/1956, MRN [id number] PCP: MOREIRA,ROY, MD  Reason: Abnormal myocardial perfusion study  ASSESSMENT;  1. High-risk myocardial perfusion study 2. Hypertension  PLAN:  1. Diagnostic coronary angiography to exclude high-risk anatomy. The procedure and risks were discussed in detail with the patient and wife. Risks of stroke, death, myocardial infarction, limb ischemia, allergy, bleeding, among others were discussed in detail and except above the patient.   SUBJECTIVE: Devin Lynn is a 58 y.o. male who is here for evaluation after a high-risk myocardial perfusion study. I initially saw him last fall. He had had an episode of atypical chest pain that occurred while running. It lasted 5 minutes. He has had it intermittently since that time. Because of the continuing episodes of recurrence, he did undergo a stress myocardial perfusion study. This study was high risk with a large region of inferior perfusion abnormality. There were no EKG changes, symptoms, or reduction in exertional tolerance. Given the continuation of nagging but atypical symptoms and now the abnormal study he will undergo diagnostic angiography to exclude CAD as the cause.   No Known Allergies  Current Outpatient Prescriptions on File Prior to Visit  Medication Sig Dispense Refill  . aspirin 81 MG tablet Take 81 mg by mouth daily.      . lisinopril (PRINIVIL,ZESTRIL) 10 MG tablet Take 10 mg by mouth daily.      . metoprolol succinate (TOPROL XL) 25 MG 24 hr tablet Take 1 tablet (25 mg total) by mouth daily.  30 tablet  5   No current facility-administered medications on file prior to visit.    Past Medical History  Diagnosis Date  . Hypertension   . Inguinal hernia, left     Past Surgical History  Procedure Laterality Date  . Circumcision  1974    History   Social History  . Marital Status: Married      Spouse Name: N/A    Number of Children: N/A  . Years of Education: N/A   Occupational History  . Not on file.   Social History Main Topics  . Smoking status: Never Smoker   . Smokeless tobacco: Never Used  . Alcohol Use: No  . Drug Use: No  . Sexual Activity: Yes   Other Topics Concern  . Not on file   Social History Narrative  . No narrative on file    Family History  Problem Relation Age of Onset  . Diabetes Mellitus II Mother   . Diabetes Mellitus II Father   . Diabetes Mellitus II Sister   . HIV/AIDS Brother     ROS: No history of diabetes, smoking, stroke, claudication, ulcer disease, anemia, dyspnea, orthopnea, PND, palpitations, or neurological abnormality.. Other systems negative for complaints.  OBJECTIVE: BP 122/76  Pulse 59  Ht 5' 9" (1.753 m),  General: No acute distress, healthy-appearing African American male HEENT: normal no pallor or jaundice Neck: JVD flat. Carotids absent Chest: Clear Cardiac: Murmur: Absent. Gallop: S4 is present. Rhythm: Normal. Other: Normal Abdomen: Bruit: Absent. Pulsation: Absent Extremities: Edema: None. Pulses: 2+ and symmetric Neuro: Normal Psych: Normal  ECG: Prior tracings that revealed sinus rhythm with left anterior hemiblock  

## 2014-04-13 NOTE — H&P (View-Only) (Signed)
Patient ID: Devin Lynn, male   DOB: [birth year], 58 y.o.   MRN: [id number]   Date: 04/09/2014 ID: Devin Lynn, DOB 1956-03-25, MRN [id number] PCP: Ralene Ok, MD  Reason: Abnormal myocardial perfusion study  ASSESSMENT;  1. High-risk myocardial perfusion study 2. Hypertension  PLAN:  1. Diagnostic coronary angiography to exclude high-risk anatomy. The procedure and risks were discussed in detail with the patient and wife. Risks of stroke, death, myocardial infarction, limb ischemia, allergy, bleeding, among others were discussed in detail and except above the patient.   SUBJECTIVE: Devin Lynn is a 58 y.o. male who is here for evaluation after a high-risk myocardial perfusion study. I initially saw him last fall. He had had an episode of atypical chest pain that occurred while running. It lasted 5 minutes. He has had it intermittently since that time. Because of the continuing episodes of recurrence, he did undergo a stress myocardial perfusion study. This study was high risk with a large region of inferior perfusion abnormality. There were no EKG changes, symptoms, or reduction in exertional tolerance. Given the continuation of nagging but atypical symptoms and now the abnormal study he will undergo diagnostic angiography to exclude CAD as the cause.   No Known Allergies  Current Outpatient Prescriptions on File Prior to Visit  Medication Sig Dispense Refill  . aspirin 81 MG tablet Take 81 mg by mouth daily.      Marland Kitchen lisinopril (PRINIVIL,ZESTRIL) 10 MG tablet Take 10 mg by mouth daily.      . metoprolol succinate (TOPROL XL) 25 MG 24 hr tablet Take 1 tablet (25 mg total) by mouth daily.  30 tablet  5   No current facility-administered medications on file prior to visit.    Past Medical History  Diagnosis Date  . Hypertension   . Inguinal hernia, left     Past Surgical History  Procedure Laterality Date  . Circumcision  1974    History   Social History  . Marital Status: Married      Spouse Name: N/A    Number of Children: N/A  . Years of Education: N/A   Occupational History  . Not on file.   Social History Main Topics  . Smoking status: Never Smoker   . Smokeless tobacco: Never Used  . Alcohol Use: No  . Drug Use: No  . Sexual Activity: Yes   Other Topics Concern  . Not on file   Social History Narrative  . No narrative on file    Family History  Problem Relation Age of Onset  . Diabetes Mellitus II Mother   . Diabetes Mellitus II Father   . Diabetes Mellitus II Sister   . HIV/AIDS Brother     ROS: No history of diabetes, smoking, stroke, claudication, ulcer disease, anemia, dyspnea, orthopnea, PND, palpitations, or neurological abnormality.. Other systems negative for complaints.  OBJECTIVE: BP 122/76  Pulse 59  Ht  (1.753 m),  General: No acute distress, healthy-appearing African American male HEENT: normal no pallor or jaundice Neck: JVD flat. Carotids absent Chest: Clear Cardiac: Murmur: Absent. Gallop: S4 is present. Rhythm: Normal. Other: Normal Abdomen: Bruit: Absent. Pulsation: Absent Extremities: Edema: None. Pulses: 2+ and symmetric Neuro: Normal Psych: Normal  ECG: Prior tracings that revealed sinus rhythm with left anterior hemiblock

## 2014-04-13 NOTE — Interval H&P Note (Signed)
PCI Cath Lab Visit (complete for each Cath Lab visit)  Clinical Evaluation Leading to the Procedure:   ACS: No.  Non-ACS:    Anginal Classification: CCS II  Anti-ischemic medical therapy: No Therapy  Non-Invasive Test Results: High-risk stress test findings: cardiac mortality >3%/year  Prior CABG: No previous CABG      History and Physical Interval Note:  04/13/2014 7:01 PM  Devin Lynn  has presented today for surgery, with the diagnosis of abnormal nuc  The various methods of treatment have been discussed with the patient and family. After consideration of risks, benefits and other options for treatment, the patient has consented to  Procedure(s): LEFT HEART CATHETERIZATION WITH CORONARY ANGIOGRAM (N/A) as a surgical intervention .  The patient's history has been reviewed, patient examined, no change in status, stable for surgery.  I have reviewed the patient's chart and labs.  Questions were answered to the patient's satisfaction.     [name]

## 2014-04-14 ENCOUNTER — Ambulatory Visit (HOSPITAL_COMMUNITY)
Admission: RE | Admit: 2014-04-14 | Discharge: 2014-04-14 | Disposition: A | Discharge status: Home / Self Care | Payer: Managed Care, Other (non HMO) | Source: Ambulatory Visit | Attending: Interventional Cardiology | Admitting: Interventional Cardiology

## 2014-04-14 ENCOUNTER — Encounter (HOSPITAL_COMMUNITY)
Admission: RE | Disposition: A | Discharge status: Home / Self Care | Payer: Self-pay | Source: Ambulatory Visit | Attending: Interventional Cardiology

## 2014-04-14 DIAGNOSIS — R0789 Other chest pain: Secondary | ICD-10-CM | POA: Insufficient documentation

## 2014-04-14 DIAGNOSIS — R079 Chest pain, unspecified: Secondary | ICD-10-CM

## 2014-04-14 DIAGNOSIS — Z7982 Long term (current) use of aspirin: Secondary | ICD-10-CM | POA: Insufficient documentation

## 2014-04-14 DIAGNOSIS — I10 Essential (primary) hypertension: Secondary | ICD-10-CM | POA: Insufficient documentation

## 2014-04-14 HISTORY — PX: LEFT HEART CATHETERIZATION WITH CORONARY ANGIOGRAM: SHX5451

## 2014-04-14 SURGERY — LEFT HEART CATHETERIZATION WITH CORONARY ANGIOGRAM
Anesthesia: LOCAL

## 2014-04-14 MED ORDER — SODIUM CHLORIDE 0.9 % IV SOLN: 1.0000 mL/kg/h | INTRAVENOUS | Status: DC

## 2014-04-14 MED ORDER — SODIUM CHLORIDE 0.9 % IV SOLN
INTRAVENOUS | Status: DC
  Administered 2014-04-14: 13:00:00 via INTRAVENOUS

## 2014-04-14 MED ORDER — OXYCODONE-ACETAMINOPHEN 5-325 MG PO TABS: 1.0000 | ORAL_TABLET | ORAL | Status: DC | PRN

## 2014-04-14 MED ORDER — LIDOCAINE HCL (PF) 1 % IJ SOLN
INTRAMUSCULAR | Status: AC
  Filled 2014-04-14: qty 30

## 2014-04-14 MED ORDER — FENTANYL CITRATE 0.05 MG/ML IJ SOLN
INTRAMUSCULAR | Status: AC
  Filled 2014-04-14: qty 2

## 2014-04-14 MED ORDER — HEPARIN (PORCINE) IN NACL 2-0.9 UNIT/ML-% IJ SOLN
INTRAMUSCULAR | Status: AC
  Filled 2014-04-14: qty 500

## 2014-04-14 MED ORDER — HEPARIN (PORCINE) IN NACL 2-0.9 UNIT/ML-% IJ SOLN
INTRAMUSCULAR | Status: AC
  Filled 2014-04-14: qty 1000

## 2014-04-14 MED ORDER — SODIUM CHLORIDE 0.9 % IJ SOLN: 3.0000 mL | Freq: Two times a day (BID) | INTRAMUSCULAR | Status: DC

## 2014-04-14 MED ORDER — SODIUM CHLORIDE 0.9 % IJ SOLN
3.0000 mL | INTRAMUSCULAR | Status: DC | PRN
Start: 2014-04-14 — End: 2014-04-14

## 2014-04-14 MED ORDER — HEPARIN SODIUM (PORCINE) 1000 UNIT/ML IJ SOLN
INTRAMUSCULAR | Status: AC
  Filled 2014-04-14: qty 1

## 2014-04-14 MED ORDER — SODIUM CHLORIDE 0.9 % IV SOLN: 250.0000 mL | INTRAVENOUS | Status: DC | PRN

## 2014-04-14 MED ORDER — ACETAMINOPHEN 325 MG PO TABS
650.0000 mg | ORAL_TABLET | ORAL | Status: DC | PRN
Start: 2014-04-14 — End: 2014-04-14

## 2014-04-14 MED ORDER — NITROGLYCERIN 1 MG/10 ML FOR IR/CATH LAB
INTRA_ARTERIAL | Status: AC
  Filled 2014-04-14: qty 10

## 2014-04-14 MED ORDER — VERAPAMIL HCL 2.5 MG/ML IV SOLN
INTRAVENOUS | Status: AC
  Filled 2014-04-14: qty 2

## 2014-04-14 MED ORDER — MIDAZOLAM HCL 2 MG/2ML IJ SOLN
INTRAMUSCULAR | Status: AC
  Filled 2014-04-14: qty 2

## 2014-04-14 MED ORDER — ONDANSETRON HCL 4 MG/2ML IJ SOLN: 4.0000 mg | Freq: Four times a day (QID) | INTRAMUSCULAR | Status: DC | PRN

## 2014-04-14 MED ORDER — ASPIRIN 81 MG PO CHEW: 81.0000 mg | CHEWABLE_TABLET | ORAL | Status: DC

## 2014-04-14 NOTE — Interval H&P Note (Signed)
Cath Lab Visit (complete for each Cath Lab visit)  Clinical Evaluation Leading to the Procedure:   ACS: No.  Non-ACS:    Anginal Classification: CCS II  Anti-ischemic medical therapy: Minimal Therapy (1 class of medications)  Non-Invasive Test Results: High-risk stress test findings: cardiac mortality >3%/year  Prior CABG: No previous CABG      History and Physical Interval Note:  04/14/2014 2:29 PM  Devin Lynn  has presented today for surgery, with the diagnosis of abnormal nuc  The various methods of treatment have been discussed with the patient and family. After consideration of risks, benefits and other options for treatment, the patient has consented to  Procedure(s): LEFT HEART CATHETERIZATION WITH CORONARY ANGIOGRAM (N/A) as a surgical intervention .  The patient's history has been reviewed, patient examined, no change in status, stable for surgery.  I have reviewed the patient's chart and labs.  Questions were answered to the patient's satisfaction.     Devin Lynn

## 2014-04-14 NOTE — CV Procedure (Signed)
     Left Heart Catheterization with Coronary Angiography Report  Devin Lynn  58 y.o.  male [birth year]  Procedure Date: 04/14/2014 Referring Physician: HWBSmith, III, MD Primary Cardiologist: HWBSmith, III  INDICATIONS: High-risk myocardial perfusion study in a patient with atypical chest pain  PROCEDURE: 1. Left heart cath; 2. Coronary angiography; 3. Left ventriculography  CONSENT:  The risks, benefits, and details of the procedure were explained in detail to the patient. Risks including death, stroke, heart attack, kidney injury, allergy, limb ischemia, bleeding and radiation injury were discussed.  The patient verbalized understanding and wanted to proceed.  Informed written consent was obtained.  PROCEDURE TECHNIQUE:  After Xylocaine anesthesia a 5 French Slender sheath was placed in the right radial artery with an angiocath and the modified Seldinger technique.  Coronary angiography was done using a 5 F JR 4 and JL 3.5 diagnostic catheter.  Left ventriculography was done using the JR 4 catheter and hand injection.   Digital images were reviewed and the case was terminated.    CONTRAST:  Total of 110 cc.  COMPLICATIONS:  None   HEMODYNAMICS:  Aortic pressure 104/60 mmHg; LV pressure 110/4 mmHg; LVEDP 7 mm mercury  ANGIOGRAPHIC DATA:   The left main coronary artery is normal..  The left anterior descending artery is normal. LAD wraps around the left ventricular apex. It gives origin to 2 large diagonal branches.  The left circumflex artery is normal. He gives origin to 3 obtuse marginal branches..  The right coronary artery is dominant and normal.    LEFT VENTRICULOGRAM:  Left ventricular angiogram was done in the 30 RAO projection and revealed normal in size with normal contractility and an estimated ejection fraction of 55%   IMPRESSIONS:  1. Normal coronary arteries.   2. Normal left ventricular size and function  3. False positive myocardial perfusion  study   RECOMMENDATION:  No further cardiac workup. No limitations.Marland Kitchen

## 2014-04-14 NOTE — Discharge Instructions (Signed)
Radial Site Care °Refer to this sheet in the next few weeks. These instructions provide you with information on caring for yourself after your procedure. Your caregiver may also give you more specific instructions. Your treatment has been planned according to current medical practices, but problems sometimes occur. Call your caregiver if you have any problems or questions after your procedure. °HOME CARE INSTRUCTIONS °· You may shower the day after the procedure. Remove the bandage (dressing) and gently wash the site with plain soap and water. Gently pat the site dry. °· Do not apply powder or lotion to the site. °· Do not submerge the affected site in water for 3 to 5 days. °· Inspect the site at least twice daily. °· Do not flex or bend the affected arm for 24 hours. °· No lifting over 5 pounds (2.3 kg) for 5 days after your procedure. °· Do not drive home if you are discharged the same day of the procedure. Have someone else drive you. °· You may drive 24 hours after the procedure unless otherwise instructed by your caregiver. °· Do not operate machinery or power tools for 24 hours. °· A responsible adult should be with you for the first 24 hours after you arrive home. °What to expect: °· Any bruising will usually fade within 1 to 2 weeks. °· Blood that collects in the tissue (hematoma) may be painful to the touch. It should usually decrease in size and tenderness within 1 to 2 weeks. °SEEK IMMEDIATE MEDICAL CARE IF: °· You have unusual pain at the radial site. °· You have redness, warmth, swelling, or pain at the radial site. °· You have drainage (other than a small amount of blood on the dressing). °· You have chills. °· You have a fever or persistent symptoms for more than 72 hours. °· You have a fever and your symptoms suddenly get worse. °· Your arm becomes pale, cool, tingly, or numb. °· You have heavy bleeding from the site. Hold pressure on the site. °Document Released: 08/11/2010 Document Revised:  10/01/2011 Document Reviewed: 08/11/2010 °ExitCare® Patient Information ©2015 [employer]. This information is not intended to replace advice given to you by your health care provider. Make sure you discuss any questions you have with your health care provider. ° °

## 2014-04-26 ENCOUNTER — Ambulatory Visit: Payer: Managed Care, Other (non HMO) | Admitting: Interventional Cardiology

## 2014-05-03 ENCOUNTER — Encounter: Payer: Self-pay | Admitting: Interventional Cardiology

## 2014-06-10 ENCOUNTER — Encounter: Payer: Self-pay | Admitting: Interventional Cardiology

## 2014-06-28 ENCOUNTER — Telehealth: Payer: Self-pay | Admitting: Interventional Cardiology

## 2014-06-28 MED ORDER — METOPROLOL SUCCINATE ER 25 MG PO TB24: 25.0000 mg | ORAL_TABLET | Freq: Every day | ORAL | Status: DC

## 2014-06-28 NOTE — Telephone Encounter (Signed)
New Msg   Pt wife Kerin PernaWanda Perz calling to see if a 90 day supply of prescription of Metoprolol Succer can be written for husband. Please contact [phone].

## 2014-06-28 NOTE — Telephone Encounter (Signed)
Rx for Metoprolol 90 dau supply sent to [facility]. Left message on pt machine

## 2014-07-01 ENCOUNTER — Encounter (HOSPITAL_COMMUNITY): Payer: Self-pay | Admitting: Interventional Cardiology

## 2015-06-05 ENCOUNTER — Other Ambulatory Visit: Payer: Self-pay | Admitting: Interventional Cardiology

## 2015-06-19 ENCOUNTER — Other Ambulatory Visit: Payer: Self-pay | Admitting: Interventional Cardiology

## 2015-07-21 ENCOUNTER — Other Ambulatory Visit: Payer: Self-pay | Admitting: Interventional Cardiology

## 2015-08-01 ENCOUNTER — Ambulatory Visit: Payer: Managed Care, Other (non HMO) | Admitting: Physician Assistant

## 2015-09-21 ENCOUNTER — Other Ambulatory Visit: Payer: Self-pay | Admitting: Interventional Cardiology

## 2015-10-08 ENCOUNTER — Other Ambulatory Visit: Payer: Self-pay | Admitting: Interventional Cardiology

## 2015-11-04 ENCOUNTER — Other Ambulatory Visit: Payer: Self-pay | Admitting: Interventional Cardiology

## 2015-11-30 ENCOUNTER — Other Ambulatory Visit: Payer: Self-pay | Admitting: Interventional Cardiology

## 2015-12-01 ENCOUNTER — Other Ambulatory Visit: Payer: Self-pay | Admitting: Interventional Cardiology

## 2015-12-02 ENCOUNTER — Other Ambulatory Visit: Payer: Self-pay | Admitting: *Deleted

## 2016-01-28 ENCOUNTER — Other Ambulatory Visit: Payer: Self-pay | Admitting: Interventional Cardiology

## 2018-02-12 ENCOUNTER — Encounter: Payer: Self-pay | Admitting: Podiatry

## 2018-02-12 ENCOUNTER — Ambulatory Visit (INDEPENDENT_AMBULATORY_CARE_PROVIDER_SITE_OTHER): Payer: Managed Care, Other (non HMO) | Admitting: Podiatry

## 2018-02-12 VITALS — Ht 69.0 in | Wt 179.0 lb

## 2018-02-12 DIAGNOSIS — M7662 Achilles tendinitis, left leg: Secondary | ICD-10-CM | POA: Diagnosis not present

## 2018-02-12 DIAGNOSIS — M24572 Contracture, left ankle: Secondary | ICD-10-CM | POA: Diagnosis not present

## 2018-02-12 NOTE — Patient Instructions (Signed)
Seen for pain in left Achilles tendon. Noted of tight Achilles tendon on right, weak first metatarsal bone on both feet. Need daily stretch exercise, change to proper shoe gear as discussed. May benefit from Hammer toe surgery for the deformed 5th toes. Return if pain continues after changing shoe gear.

## 2018-02-12 NOTE — Progress Notes (Signed)
SUBJECTIVE: 62 y.o. year old male presents complaining of pain on back of left heel duratation of 3 months. Seen by PCP and treated with NSAIA and patches. Pain is not unbearable but able to feel it.  Review of Systems  Constitutional: Negative.   HENT: Negative.   Eyes: Negative.   Respiratory: Negative.   Cardiovascular: Negative.   Gastrointestinal: Negative.   Genitourinary: Negative.   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  Skin: Negative.      OBJECTIVE: DERMATOLOGIC EXAMINATION: Normal findings.  VASCULAR EXAMINATION OF LOWER LIMBS: All pedal pulses are palpable with normal pulsation.  Capillary Filling times within 3 seconds in all digits.  No edema or erythema noted. Temperature gradient from tibial crest to dorsum of foot is within normal bilateral.  NEUROLOGIC EXAMINATION OF THE LOWER LIMBS: All epicritic and tactile sensations grossly intact. Sharp and Dull discriminatory sensations at the plantar ball of hallux is intact bilateral.   MUSCULOSKELETAL EXAMINATION: Positive for pain in left Achilles tendon. Enlarged 5th Metatarsal head, Contracted 5th toe with digital lesion. Excess sagittal plane motion of the first metatarsal bilateral.  ASSESSMENT: Achilles tendonitis left. Ankle equinus right. 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 Lateral weight shifting right.  PLAN: Reviewed findings and available treatment options, proper shoe gear, orthotics, stretch exercise. Patient will keep up with proper shoe gear and stretch exercise. Return if pain remains.

## 2019-10-12 ENCOUNTER — Encounter: Payer: Self-pay | Admitting: Interventional Cardiology

## 2019-10-12 ENCOUNTER — Ambulatory Visit (INDEPENDENT_AMBULATORY_CARE_PROVIDER_SITE_OTHER): Payer: Managed Care, Other (non HMO) | Admitting: Interventional Cardiology

## 2019-10-12 ENCOUNTER — Other Ambulatory Visit: Payer: Self-pay

## 2019-10-12 VITALS — BP 128/82 | HR 67 | Ht 69.0 in | Wt 180.0 lb

## 2019-10-12 DIAGNOSIS — Z7189 Other specified counseling: Secondary | ICD-10-CM | POA: Diagnosis not present

## 2019-10-12 DIAGNOSIS — I10 Essential (primary) hypertension: Secondary | ICD-10-CM | POA: Diagnosis not present

## 2019-10-12 NOTE — Patient Instructions (Signed)

## 2019-10-12 NOTE — Progress Notes (Signed)
Cardiology Office Note:    Date:  10/12/2019   ID:  Devin Lynn, DOB 1956/06/15, MRN [id number]  PCP:  Ralene Ok, MD  Cardiologist:  No primary care provider on file.   Referring MD: Ralene Ok, MD   Chief Complaint  Patient presents with  . Advice Only    CV risk assessment    History of Present Illness:    Devin Lynn is a 64 y.o. male with a hx of prior abnormal nuclear study suggesting high risk, coronary angiography performed in 2015 demonstrating normal coronary arteries, left ventricular size, and function, hypertension, and family history of CAD.  Has had atypical chest pain in the past which led to the aforementioned work-up.  Devin Lynn underwent extensive cardiac evaluation in 2015.  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  A nuclear study was done and "was high risk".  Subsequently underwent coronary angiography that demonstrated widely patent/normal coronary arteries with normal LV function.  He returns now 6 years later, feeling relatively well.  He is back for cardiovascular checkup.  Diabetes runs in his family but there is no personal history of diabetes, smoking, alcohol use, hyperlipidemia, or vascular problems.  He does not sleep well.  Probably gets less than 4 hours of sleep per day.  He does have hypertension and is on lisinopril.  He has been relatively sedentary because of bilateral knee discomfort.  With physical activity he is not experiencing any chest discomfort or cardiopulmonary concerns.  Past Medical History:  Diagnosis Date  . Hypertension   . Inguinal hernia, left     Past Surgical History:  Procedure Laterality Date  . CIRCUMCISION  1974  . LEFT HEART CATHETERIZATION WITH CORONARY ANGIOGRAM N/A 04/14/2014   Procedure: LEFT HEART CATHETERIZATION WITH CORONARY ANGIOGRAM;  Surgeon: [name], MD;  Location: [facility] CATH LAB;  Service: Cardiovascular;  Laterality: N/A;     Current Medications: Current Meds  Medication Sig  . aspirin 81 MG tablet Take 81 mg by mouth daily.  Marland Kitchen lisinopril (PRINIVIL,ZESTRIL) 10 MG tablet Take 10 mg by mouth daily.  Marland Kitchen loratadine (CLARITIN) 10 MG tablet Take 10 mg by mouth daily as needed for allergies.     Allergies:   Patient has no known allergies.   Social History   Socioeconomic History  . Marital status: Married    Spouse name: Not on file  . Number of children: Not on file  . Years of education: Not on file  . Highest education level: Not on file  Occupational History  . Not on file  Tobacco Use  . Smoking status: Never Smoker  . Smokeless tobacco: Never Used  Substance and Sexual Activity  . Alcohol use: No  . Drug use: No  . Sexual activity: Yes  Other Topics Concern  . Not on file  Social History Narrative  . Not on file   Social Determinants of Health   Financial Resource Strain:   . Difficulty of Paying Living Expenses:   Food Insecurity:   . Worried About Programme researcher, broadcasting/film/video in the Last Year:   . Barista in the Last Year:   Transportation Needs:   . Freight forwarder (Medical):   Marland Kitchen Lack of Transportation (Non-Medical):   Physical Activity:   . Days of Exercise per Week:   . Minutes of Exercise per Session:   Stress:   . Feeling of Stress :  Social Connections:   . Frequency of Communication with Friends and Family:   . Frequency of Social Gatherings with Friends and Family:   . Attends Religious Services:   . Active Member of Clubs or Organizations:   . Attends Banker Meetings:   Marland Kitchen Marital Status:      Family History: The patient's family history includes Diabetes Mellitus II in his father, mother, and sister; HIV/AIDS in his brother.  ROS:   Please see the history of present illness.    Not sleeping well but no reports that he snores.  He can fall asleep during the day.  All other systems reviewed and are negative.  EKGs/Labs/Other Studies  Reviewed:    The following studies were reviewed today: No new or recent data  EKG:  EKG normal sinus rhythm, left anterior hemiblock, poor R wave progression, and when compared to prior tracings, no significant changes noted when compared to 2015 EKGs.  Recent Labs: No results found for requested labs within last 8760 hours.  Recent Lipid Panel No results found for: CHOL, TRIG, HDL, CHOLHDL, VLDL, LDLCALC, LDLDIRECT  Physical Exam:    VS:  BP 128/82   Pulse 67   Ht 5\' 9"  (1.753 m)   Wt 180 lb (81.6 kg)   SpO2 96%   BMI 26.58 kg/m     Wt Readings from Last 3 Encounters:  10/12/19 180 lb (81.6 kg)  02/12/18 179 lb (81.2 kg)  04/14/14 179 lb (81.2 kg)     GEN: Healthy-appearing, younger than stated age. No acute distress HEENT: Normal NECK: No JVD. LYMPHATICS: No lymphadenopathy CARDIAC:  RRR without murmur, gallop, or edema. VASCULAR:  Normal Pulses. No bruits. RESPIRATORY:  Clear to auscultation without rales, wheezing or rhonchi  ABDOMEN: Soft, non-tender, non-distended, No pulsatile mass, MUSCULOSKELETAL: No deformity  SKIN: Warm and dry NEUROLOGIC:  Alert and oriented x 3 PSYCHIATRIC:  Normal affect   ASSESSMENT:    1. Essential hypertension   2. Cardiac risk counseling   3. Educated about COVID-19 virus infection    PLAN:    In order of problems listed above:  1. Blood pressure is under excellent control on current therapy.  Target is 130/80 mmHg. 2. Primary prevention was discussed.  Particular emphasis was placed on achieving at least 30 minutes of moderate activity 5 out of 7 days in each week.  He also needs to improve his sleep habits.  Recent laboratory data including a lipid panel was done by Dr. 04/16/14.  He will send Devin Lynn copies of that information which I will review and make further recommendations depending upon the results. 3. COVID-19 vaccine is excepted and will be taken when available.  COVID-19 social distancing and mask wearing is being  practiced.  Overall education and awareness concerning primary risk prevention was discussed in detail: LDL less than 70, hemoglobin A1c less than 7, blood pressure target less than 130/80 mmHg, >150 minutes of moderate aerobic activity per week, avoidance of smoking, weight control (via diet and exercise), and continued surveillance/management of/for obstructive sleep apnea.    Medication Adjustments/Labs and Tests Ordered: Current medicines are reviewed at length with the patient today.  Concerns regarding medicines are outlined above.  Orders Placed This Encounter  Procedures  . EKG 12-Lead   No orders of the defined types were placed in this encounter.   Patient Instructions  Medication Instructions:  Your physician recommends that you continue on your current medications as directed. Please refer to the Current Medication  list given to you today.  *If you need a refill on your cardiac medications before your next appointment, please call your pharmacy*   Lab Work: None If you have labs (blood work) drawn today and your tests are completely normal, you will receive your results only by: Marland Kitchen MyChart Message (if you have MyChart) OR . A paper copy in the mail If you have any lab test that is abnormal or we need to change your treatment, we will call you to review the results.   Testing/Procedures: None   Follow-Up: At [facility], you and your health needs are our priority.  As part of our continuing mission to provide you with exceptional heart care, we have created designated Provider Care Teams.  These Care Teams include your primary Cardiologist (physician) and Advanced Practice Providers (APPs -  Physician Assistants and Nurse Practitioners) who all work together to provide you with the care you need, when you need it.  We recommend signing up for the patient portal called "MyChart".  Sign up information is provided on this After Visit Summary.  MyChart is used to connect  with patients for Virtual Visits (Telemedicine).  Patients are able to view lab/test results, encounter notes, upcoming appointments, etc.  Non-urgent messages can be sent to your provider as well.   To learn more about what you can do with MyChart, go to [email or web address].    Your next appointment:   As needed  The format for your next appointment:   In Person  Provider:   You may see Dr. Daneen Schick or one of the following Advanced Practice Providers on your designated Care Team:    Truitt Merle, NP  Cecilie Kicks, NP  Kathyrn Drown, NP    Other Instructions      Signed, Sinclair Grooms, MD  10/12/2019 3:07 PM    [place]

## 2019-10-14 ENCOUNTER — Telehealth: Payer: Self-pay | Admitting: Interventional Cardiology

## 2019-10-14 NOTE — Telephone Encounter (Signed)
Medical records requested from Ralene Ok, MD. 10/14/19 vlm

## 2019-10-19 ENCOUNTER — Other Ambulatory Visit: Payer: Self-pay

## 2019-10-19 ENCOUNTER — Other Ambulatory Visit: Payer: Self-pay | Admitting: Internal Medicine

## 2019-10-19 ENCOUNTER — Other Ambulatory Visit: Payer: Managed Care, Other (non HMO)

## 2019-10-19 ENCOUNTER — Ambulatory Visit
Admission: RE | Admit: 2019-10-19 | Discharge: 2019-10-19 | Disposition: A | Discharge status: Home / Self Care | Payer: Managed Care, Other (non HMO) | Source: Ambulatory Visit | Attending: Internal Medicine | Admitting: Internal Medicine

## 2019-10-19 DIAGNOSIS — R319 Hematuria, unspecified: Secondary | ICD-10-CM

## 2019-10-20 ENCOUNTER — Other Ambulatory Visit: Payer: Self-pay

## 2019-10-20 ENCOUNTER — Ambulatory Visit
Admission: RE | Admit: 2019-10-20 | Discharge: 2019-10-20 | Disposition: A | Discharge status: Home / Self Care | Payer: Managed Care, Other (non HMO) | Source: Ambulatory Visit | Attending: Internal Medicine | Admitting: Internal Medicine

## 2019-10-20 DIAGNOSIS — R319 Hematuria, unspecified: Secondary | ICD-10-CM

## 2019-10-30 ENCOUNTER — Telehealth: Payer: Self-pay | Admitting: Interventional Cardiology

## 2019-10-30 NOTE — Telephone Encounter (Signed)
New Message ° ° ° °Pt is returning call  °

## 2020-04-24 ENCOUNTER — Other Ambulatory Visit: Payer: Self-pay | Admitting: Internal Medicine

## 2020-04-24 MED ORDER — AZITHROMYCIN 250 MG PO TABS: ORAL_TABLET | ORAL | 0 refills | Status: AC

## 2020-09-03 IMAGING — CT CT ABD-PELV W/O CM
1 series · 15 of 32 positions shown, 19 images · non-contrast
Comparison: CT abdomen pelvis report dated July 27, 2005.

CLINICAL DATA: Lower abdominal pain and hematuria for the past 5
days.

EXAM:
CT ABDOMEN AND PELVIS WITHOUT CONTRAST
TECHNIQUE: Multidetector CT imaging of the abdomen and pelvis was performed
following the standard protocol without IV contrast.

[Series 5: lung · axial · 0.71mm/px · z∈[-118,-14]mm · 15 of 58 slices shown, 19 images]
[im 4/58  soft-tissue]
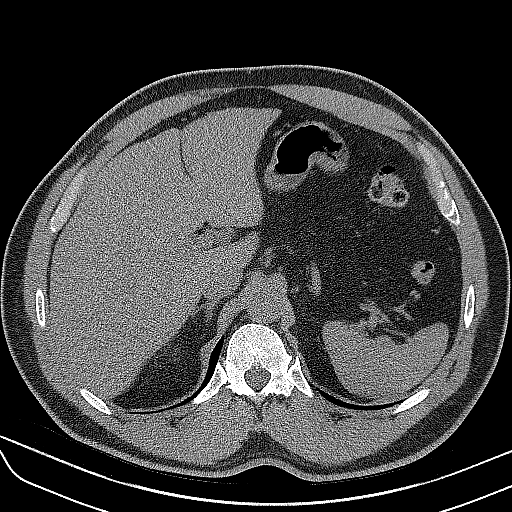
[im 4/58  bone]
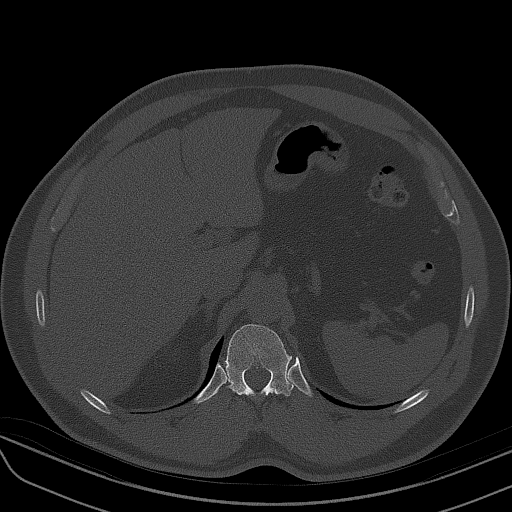
[im 8/58  soft-tissue]
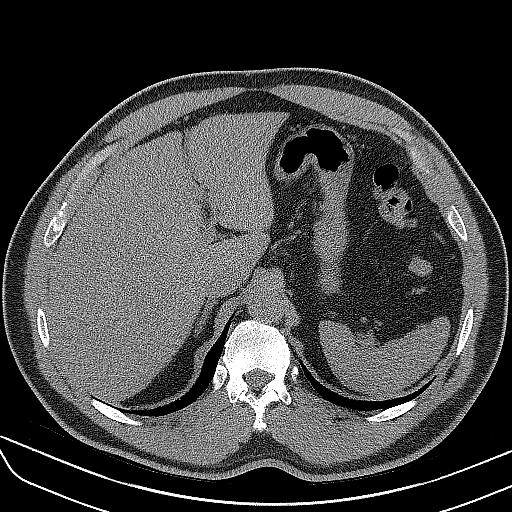
[im 12/58  soft-tissue]
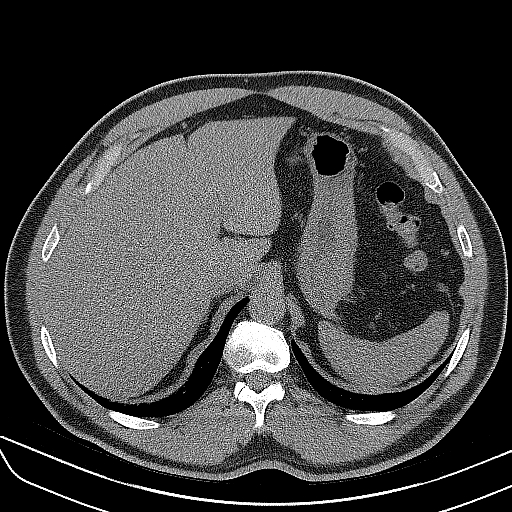
[im 17/58  soft-tissue]
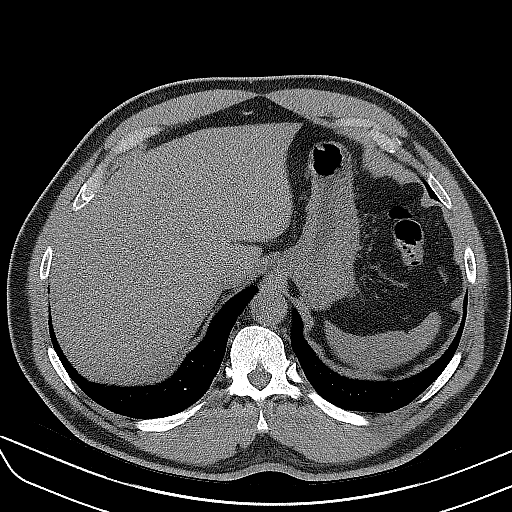
[im 21/58  soft-tissue]
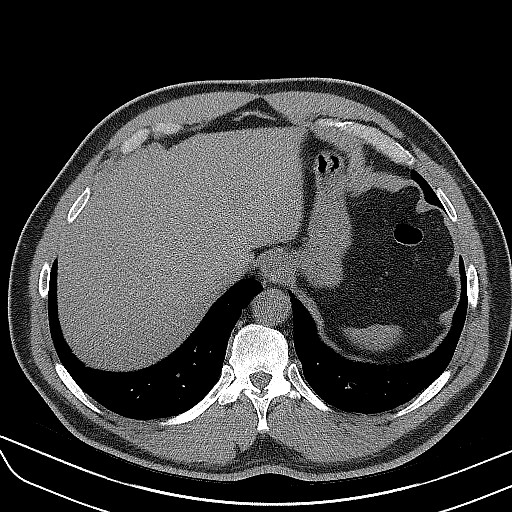
[im 24/58  soft-tissue]
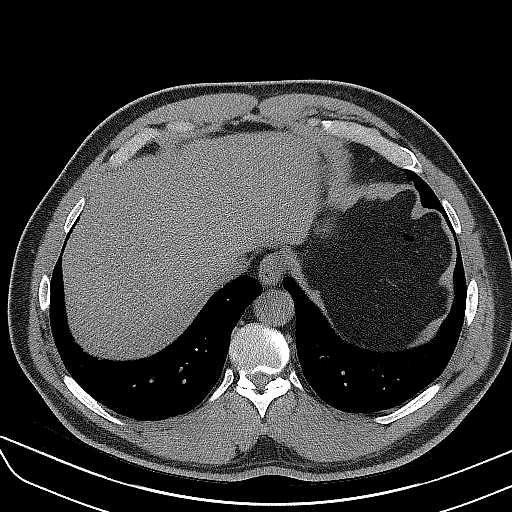
[im 30/58  soft-tissue]
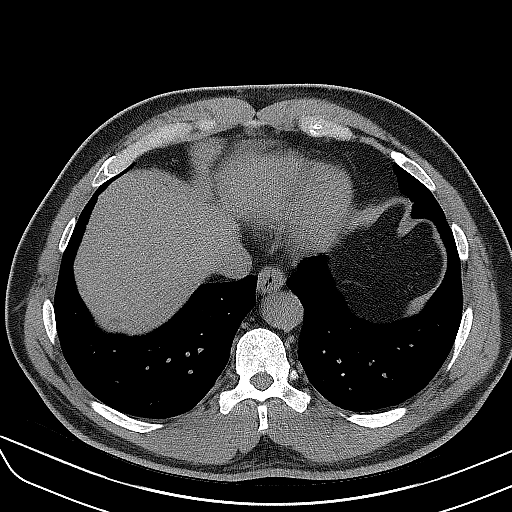
[im 34/58  soft-tissue]
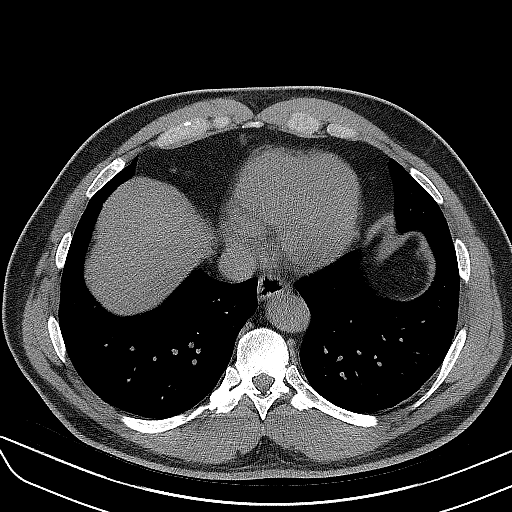
[im 37/58  soft-tissue]
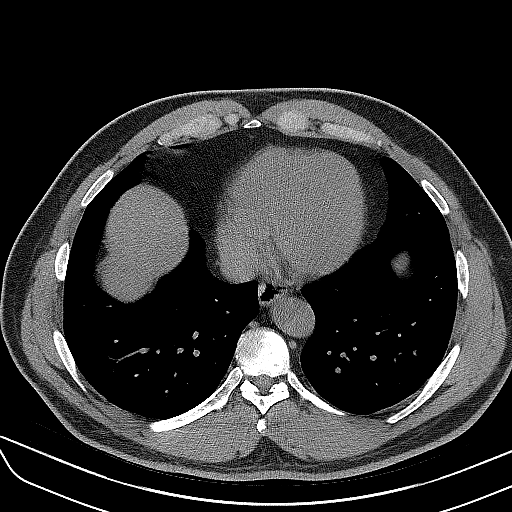
[im 37/58  bone]
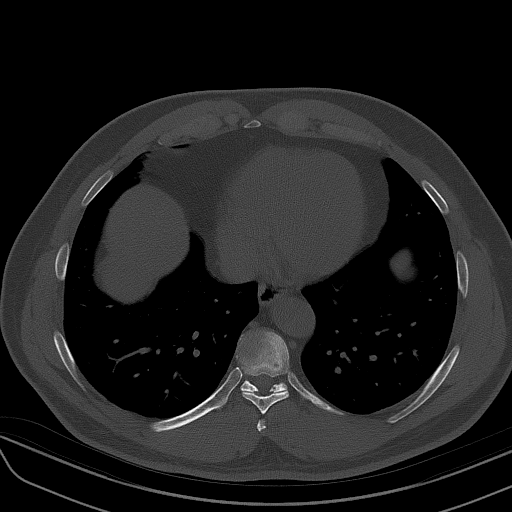
[im 41/58  soft-tissue]
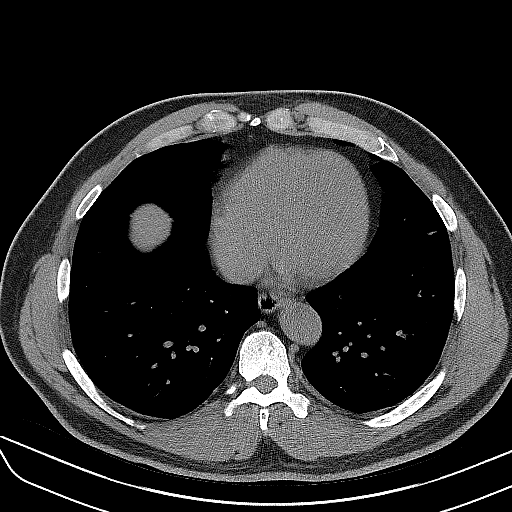
[im 46/58  soft-tissue]
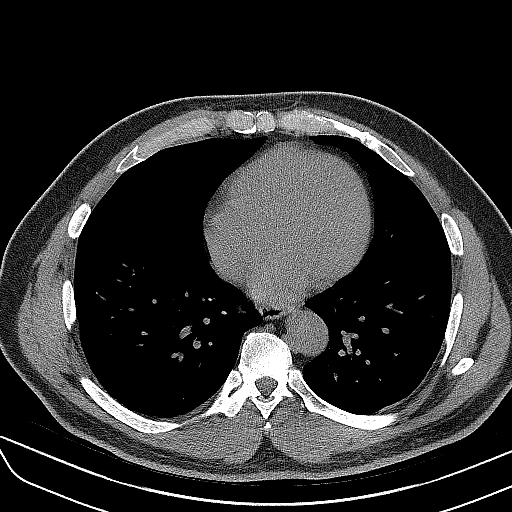
[im 50/58  soft-tissue]
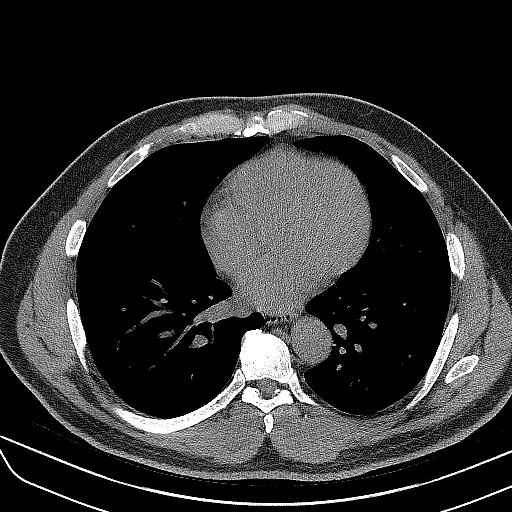
[im 50/58  lung]
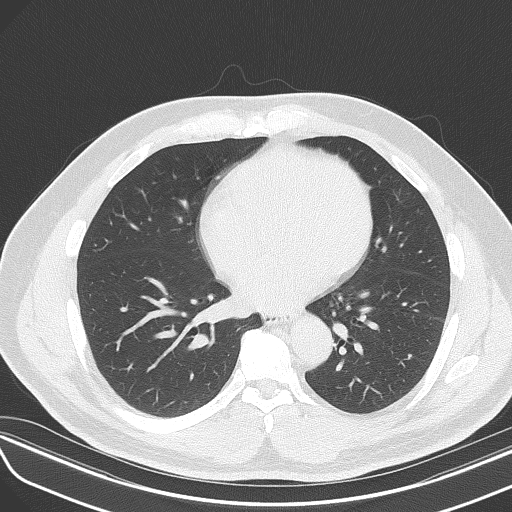
[im 52/58  lung]
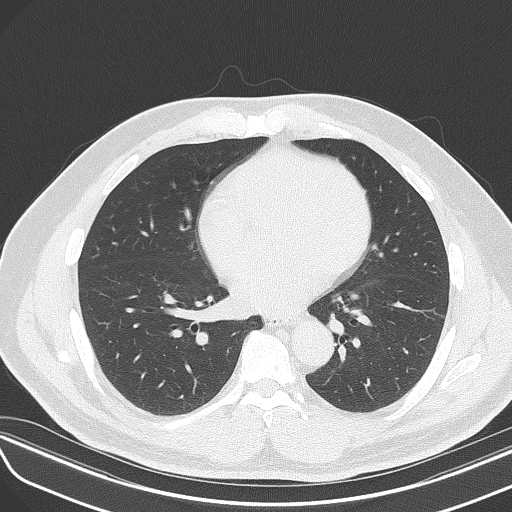
[im 54/58  soft-tissue]
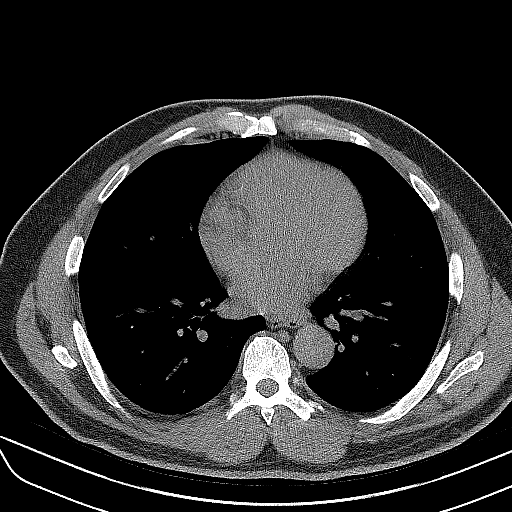
[im 54/58  lung]
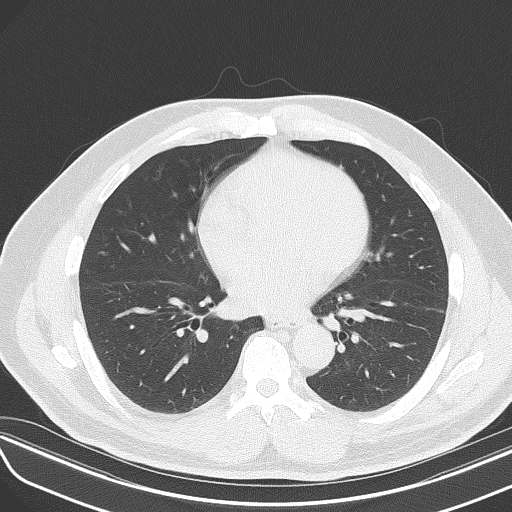
[im 56/58  lung]
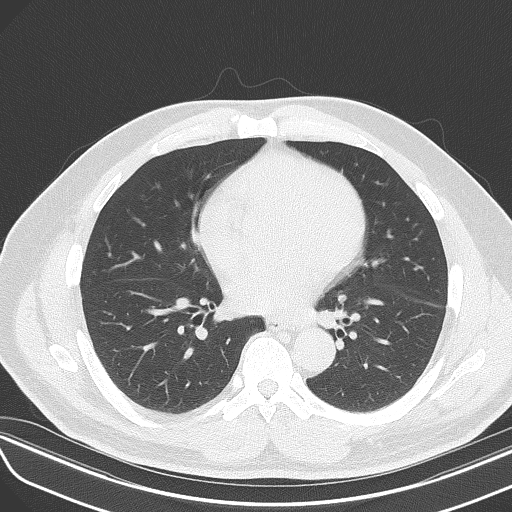

[15 of 32 positions shown; findings below may reference images not displayed]

FINDINGS: Lower chest: No acute abnormality.

Hepatobiliary: No focal liver abnormality is seen. No gallstones,
gallbladder wall thickening, or biliary dilatation.

Pancreas: Unremarkable. No pancreatic ductal dilatation or
surrounding inflammatory changes.

Spleen: Normal in size without focal abnormality.

Adrenals/Urinary Tract: Adrenal glands are unremarkable. Kidneys are
normal, without renal calculi, focal lesion, or hydronephrosis.
Bladder is unremarkable.

Stomach/Bowel: Stomach is within normal limits. Appendix appears
normal. No evidence of bowel wall thickening, distention, or
inflammatory changes.

Vascular/Lymphatic: No significant vascular findings are present. No
enlarged abdominal or pelvic lymph nodes.

Reproductive: Borderline prostatomegaly. Trace bilateral hydroceles.
1.2 cm irregular area of hypodensity in the right testicle (series
2, image 90).

Other: No free fluid or pneumoperitoneum. Prior left inguinal hernia
repair. Tiny fat containing umbilical hernia.

Musculoskeletal: No acute or significant osseous findings.
IMPRESSION: 1. No acute intra-abdominal process.
2. 1.2 cm irregular area of hypodensity in the right testicle.
Testicular ultrasound recommended to exclude underlying lesion.

## 2020-11-29 ENCOUNTER — Telehealth: Payer: Self-pay | Admitting: Interventional Cardiology

## 2020-11-29 NOTE — Telephone Encounter (Signed)
Patient's wife states the patient is having indigestion and they are concerned about it today. She states it is not an emergency, but would like to know if the patient can be seen soon by Dr. Katrinka Blazing. I did not see anything until September and she did agree to schedule with Nada Boozer 12/09/2020. She states they know when the patient needs to go to the ED, but would like to know if there is anything sooner than September with Dr. Katrinka Blazing.

## 2020-11-29 NOTE — Telephone Encounter (Signed)
Spoke with pt and he states that he has been having issues with what feels like indigestion.  Then goes on to mention that he has noticed some feelings of pressure on the right side of his chest.  Denies pain.  Denies SOB or other issues.  No vitals available.  States the pressure on the right side is like a little "twinge".  Advised pt to keep appt with Nada Boozer on the 20th and to go to ER if any changes in symptoms.  Pt verbalized understanding and was in agreement with plan.

## 2020-12-08 NOTE — Progress Notes (Addendum)
Cardiology Office Note   Date:  12/09/2020   ID:  Ludie, Pavlik Oct 23, 1955, MRN [id number]  PCP:  Ralene Ok, MD  Cardiologist:  Dr. Katrinka Blazing    Chief Complaint  Patient presents with  . Chest Pain      History of Present Illness: Devin Lynn is a 65 y.o. male who presents for recent chest discomfort.   He has a hx of prior abnormal nuclear study suggesting high risk, coronary angiography performed in 2015 demonstrating normal coronary arteries, left ventricular size, and function, hypertension, and family history of CAD.  Has had atypical chest pain in the past which led to the aforementioned work-up.  Devin Lynn underwent extensive cardiac evaluation in 2015.  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  A nuclear study was done and "was high risk".  Subsequently underwent coronary angiography that demonstrated widely patent/normal coronary arteries with normal LV function.  Last visit he was stable.  Now he has had 2 episodes of pinching in Rt chest, lasted about an hour, occurred after activity.  No SOB, no diaphoresis, no lightheadedness.   No other symptoms except Lt lower calf pain with activity.  Rare occurrence, usually mowing the yard. He had been on car trip to [place] but did stop and leg pain was before that trip.      Past Medical History:  Diagnosis Date  . Hypertension   . Inguinal hernia, left     Past Surgical History:  Procedure Laterality Date  . CIRCUMCISION  1974  . LEFT HEART CATHETERIZATION WITH CORONARY ANGIOGRAM N/A 04/14/2014   Procedure: LEFT HEART CATHETERIZATION WITH CORONARY ANGIOGRAM;  Surgeon: [name], MD;  Location[facility] CATH LAB;  Service: Cardiovascular;  Laterality: N/A;     Current Outpatient Medications  Medication Sig Dispense Refill  . aspirin 81 MG tablet Take 81 mg by mouth daily.    Marland Kitchen lisinopril (ZESTRIL) 20 MG tablet Take 20 mg by mouth daily.     No  current facility-administered medications for this visit.    Allergies:   Patient has no known allergies.    Social History:  The patient  reports that he has never smoked. He has never used smokeless tobacco. He reports that he does not drink alcohol and does not use drugs.   Family History:  The patient's family history includes Diabetes Mellitus II in his father, mother, and sister; HIV/AIDS in his brother.    ROS:  General:no colds or fevers, no weight changes Skin:no rashes or ulcers HEENT:no blurred vision, no congestion CV:see HPI PUL:see HPI GI:no diarrhea constipation or melena, no indigestion GU:no hematuria, no dysuria MS:no joint pain, no claudication, pain in lower leg close to ankle  Neuro:no syncope, no lightheadedness Endo:no diabetes, no thyroid disease  Wt Readings from Last 3 Encounters:  10/12/19 180 lb (81.6 kg)  02/12/18 179 lb (81.2 kg)  04/14/14 179 lb (81.2 kg)     PHYSICAL EXAM: VS:  BP 138/82   Pulse 72   Ht 5\' 9"  (1.753 m)   SpO2 96%   BMI 26.58 kg/m  , BMI Body mass index is 26.58 kg/m. General:Pleasant affect, NAD Skin:Warm and dry, brisk capillary refill HEENT:normocephalic, sclera clear, mucus membranes moist Neck:supple, no JVD, no bruits  Heart:S1S2 RRR without murmur, gallup, rub or click Lungs:clear without rales, rhonchi, or wheezes , non tender, + BS, do not palpate liver spleen or masses Ext:no lower  ext edema, Lt leg with no edema or redness no pain to palpation. 2+ pedal pulses, 2+ radial pulses Neuro:alert and oriented, MAE, follows commands, + facial symmetry    EKG:  EKG is ordered today. The ekg ordered today demonstrates SR with LAHB no changes from prior EKGs   Recent Labs: No results found for requested labs within last 8760 hours.    Lipid Panel No results found for: CHOL, TRIG, HDL, CHOLHDL, VLDL, LDLCALC, LDLDIRECT     Other studies Reviewed: Additional studies/ records that were reviewed today  include: 2015 . The stress test is abnormal. Interpretation is that of a high risk study. He needs to be set up for a coronary angiogram (left heart cath) with possible PCI. Not allergic start aspirin 81 mg per day,: Nitroglycerin 0.4 mg and explained how to use, and start metoprolol succinate 25 mg daily. Have him come in along with his wife to discuss on Friday and perhaps I can do it early next week  Cardiac cath 2015  Normal coronary arteries.   ASSESSMENT AND PLAN:  1.  atypical chest pain, a pinching pain on rt.  It has been 7 years since cath.  Will check with Dr. Katrinka Blazing either exercise [facility] [place] or cardiac CTA  -- Dr Katrinka Blazing does prefer cardiac CTA.  Will arrange and notify pt.    2.  HTN BP has been elevated and PCP increase his lisinopril.  He will call if remains elevated.    3.  Lipids have been stable.    Current medicines are reviewed with the patient today.  The patient Has no concerns regarding medicines.  The following changes have been made:  See above Labs/ tests ordered today include:see above  Disposition:   FU:  see above  Signed, Nada Boozer, NP  12/09/2020 8:49 AM    [facility] [address] [place], [place], [place]  [address]/ [address] [place], [place] Phone: [phone]; Fax: [phone]  [phone]

## 2020-12-09 ENCOUNTER — Ambulatory Visit (INDEPENDENT_AMBULATORY_CARE_PROVIDER_SITE_OTHER): Payer: Managed Care, Other (non HMO) | Admitting: Cardiology

## 2020-12-09 ENCOUNTER — Other Ambulatory Visit: Payer: Self-pay

## 2020-12-09 ENCOUNTER — Encounter: Payer: Self-pay | Admitting: Cardiology

## 2020-12-09 VITALS — BP 138/82 | HR 72 | Ht 69.0 in

## 2020-12-09 DIAGNOSIS — R079 Chest pain, unspecified: Secondary | ICD-10-CM | POA: Diagnosis not present

## 2020-12-09 DIAGNOSIS — R072 Precordial pain: Secondary | ICD-10-CM | POA: Diagnosis not present

## 2020-12-09 DIAGNOSIS — I10 Essential (primary) hypertension: Secondary | ICD-10-CM

## 2020-12-09 NOTE — Patient Instructions (Signed)
Medication Instructions:  Your physician recommends that you continue on your current medications as directed. Please refer to the Current Medication list given to you today.  *If you need a refill on your cardiac medications before your next appointment, please call your pharmacy*   Lab Work: None ordered   If you have labs (blood work) drawn today and your tests are completely normal, you will receive your results only by: Marland Kitchen MyChart Message (if you have MyChart) OR . A paper copy in the mail If you have any lab test that is abnormal or we need to change your treatment, we will call you to review the results.   Testing/Procedures: None ordered    Follow-Up: At [facility], you and your health needs are our priority.  As part of our continuing mission to provide you with exceptional heart care, we have created designated Provider Care Teams.  These Care Teams include your primary Cardiologist (physician) and Advanced Practice Providers (APPs -  Physician Assistants and Nurse Practitioners) who all work together to provide you with the care you need, when you need it.  We recommend signing up for the patient portal called "MyChart".  Sign up information is provided on this After Visit Summary.  MyChart is used to connect with patients for Virtual Visits (Telemedicine).  Patients are able to view lab/test results, encounter notes, upcoming appointments, etc.  Non-urgent messages can be sent to your provider as well.   To learn more about what you can do with MyChart, go to [email or web address].    Your next appointment:   12 month(s)  The format for your next appointment:   In Person  Provider:   You may see Dr. Verdis Prime or one of the following Advanced Practice Providers on your designated Care Team:    Georgie Chard, NP    Other Instructions We will contact you about further cardiac testing

## 2020-12-14 ENCOUNTER — Telehealth: Payer: Self-pay | Admitting: Cardiology

## 2020-12-14 ENCOUNTER — Encounter: Payer: Self-pay | Admitting: *Deleted

## 2020-12-14 MED ORDER — METOPROLOL TARTRATE 50 MG PO TABS: ORAL_TABLET | ORAL | 0 refills | Status: DC

## 2020-12-14 NOTE — Addendum Note (Signed)
Addended by: Julio Sicks on: 12/14/2020 09:56 AM   Modules accepted: Orders

## 2020-12-14 NOTE — Telephone Encounter (Signed)
Devin Brand, NP  Julio Sicks, RN Can you arrange cardiac CTA for pt, only needs 50 of BB due to lower Hr. Let him know Dr. Katrinka Blazing recommended this test, I had reviewed it with him. [name] was going to arrange and she may have. But just to make sure. Thanks. [name] with pt and reviewed recommendations.  Advised I will place instructions in the mail to him and to call if any questions.  He is aware of the need to take Metoprolol prior.  Pt appreciative for call.

## 2020-12-14 NOTE — Addendum Note (Signed)
Addended by: Julio Sicks on: 12/14/2020 09:53 AM   Modules accepted: Orders

## 2020-12-15 ENCOUNTER — Other Ambulatory Visit: Payer: Self-pay | Admitting: Interventional Cardiology

## 2021-01-18 ENCOUNTER — Telehealth: Payer: Self-pay | Admitting: *Deleted

## 2021-01-18 NOTE — Telephone Encounter (Signed)
BMET order in.   Called pt and left message to call back to schedule lab.

## 2021-01-18 NOTE — Telephone Encounter (Signed)
-----   Message from Lorrin Jackson sent at 01/17/2021 10:56 AM EDT ----- Regarding: ct heart scheduled 01/26/21 at 7:45   He will need labs done for ct scan.  Thanks, [place]

## 2021-01-20 ENCOUNTER — Telehealth (HOSPITAL_COMMUNITY): Payer: Self-pay | Admitting: *Deleted

## 2021-01-20 NOTE — Telephone Encounter (Signed)
Reaching out to patient to offer assistance regarding upcoming cardiac imaging study; patient's wife answered phone.  She was informed about the need for blood work and to review instructions.  She is give message to patient for him to call back.  Larey Brick RN Navigator Cardiac Imaging [facility] [phone] office [phone] cell

## 2021-01-24 ENCOUNTER — Other Ambulatory Visit: Payer: Managed Care, Other (non HMO)

## 2021-01-26 ENCOUNTER — Other Ambulatory Visit: Payer: Self-pay

## 2021-01-26 ENCOUNTER — Other Ambulatory Visit (HOSPITAL_COMMUNITY): Payer: Self-pay | Admitting: Emergency Medicine

## 2021-01-26 ENCOUNTER — Ambulatory Visit (HOSPITAL_COMMUNITY)
Admission: RE | Admit: 2021-01-26 | Discharge: 2021-01-26 | Disposition: A | Discharge status: Home / Self Care | Payer: Managed Care, Other (non HMO) | Source: Ambulatory Visit | Attending: Interventional Cardiology | Admitting: Interventional Cardiology

## 2021-01-26 ENCOUNTER — Other Ambulatory Visit: Payer: Self-pay | Admitting: Cardiology

## 2021-01-26 ENCOUNTER — Ambulatory Visit (HOSPITAL_COMMUNITY)
Admission: RE | Admit: 2021-01-26 | Discharge: 2021-01-26 | Disposition: A | Discharge status: Home / Self Care | Payer: Managed Care, Other (non HMO) | Source: Ambulatory Visit | Attending: Cardiology | Admitting: Cardiology

## 2021-01-26 DIAGNOSIS — R931 Abnormal findings on diagnostic imaging of heart and coronary circulation: Secondary | ICD-10-CM | POA: Diagnosis present

## 2021-01-26 DIAGNOSIS — R072 Precordial pain: Secondary | ICD-10-CM

## 2021-01-26 DIAGNOSIS — R079 Chest pain, unspecified: Secondary | ICD-10-CM | POA: Insufficient documentation

## 2021-01-26 MED ORDER — NITROGLYCERIN 0.4 MG SL SUBL
SUBLINGUAL_TABLET | SUBLINGUAL | Status: AC
  Filled 2021-01-26: qty 2

## 2021-01-26 MED ORDER — NITROGLYCERIN 0.4 MG SL SUBL
0.8000 mg | SUBLINGUAL_TABLET | Freq: Once | SUBLINGUAL | Status: AC
  Administered 2021-01-26: 0.8 mg via SUBLINGUAL

## 2021-01-26 MED ORDER — IOHEXOL 350 MG/ML SOLN
95.0000 mL | Freq: Once | INTRAVENOUS | Status: AC | PRN
  Administered 2021-01-26: 95 mL via INTRAVENOUS

## 2021-01-27 LAB — POCT I-STAT CREATININE: Creatinine, Ser: 0.9 mg/dL (ref 0.61–1.24)

## 2021-01-30 ENCOUNTER — Telehealth: Payer: Self-pay | Admitting: Interventional Cardiology

## 2021-01-30 NOTE — Telephone Encounter (Signed)
Attempted to contact pt.  Phone rang several times with no answer and no VM. 

## 2021-01-30 NOTE — Telephone Encounter (Signed)
Patient is returning call to go over CT results.

## 2021-02-01 NOTE — Telephone Encounter (Signed)
Informed pt of results. Pt verbalized understanding. 

## 2021-03-06 ENCOUNTER — Ambulatory Visit
Admission: RE | Admit: 2021-03-06 | Discharge: 2021-03-06 | Disposition: A | Discharge status: Home / Self Care | Payer: Managed Care, Other (non HMO) | Source: Ambulatory Visit | Attending: Internal Medicine | Admitting: Internal Medicine

## 2021-03-06 ENCOUNTER — Other Ambulatory Visit: Payer: Self-pay | Admitting: Internal Medicine

## 2021-03-06 ENCOUNTER — Other Ambulatory Visit: Payer: Self-pay

## 2021-03-06 DIAGNOSIS — T1490XA Injury, unspecified, initial encounter: Secondary | ICD-10-CM

## 2021-04-05 ENCOUNTER — Telehealth: Payer: Self-pay | Admitting: *Deleted

## 2021-04-05 NOTE — Telephone Encounter (Signed)
   [facility] Pre-operative Risk Assessment    Patient Name: Devin Lynn  DOB: [birth year] MRN: [id number]  HEARTCARE STAFF:  - IMPORTANT!!!!!! Under Visit Info/Reason for Call, type in Other and utilize the format Clearance MM/DD/YY or Clearance TBD. Do not use dashes or single digits. - Please review there is not already an duplicate clearance open for this procedure. - If request is for dental extraction, please clarify the # of teeth to be extracted. - If the patient is currently at the dentist's office, call Pre-Op Callback Staff (MA/nurse) to input urgent request.  - If the patient is not currently in the dentist office, please route to the Pre-Op pool.  Request for surgical clearance:  What type of surgery is being performed?  COLONOSCOPY  When is this surgery scheduled?  04/13/21  What type of clearance is required (medical clearance vs. Pharmacy clearance to hold med vs. Both)?  BOTH   Are there any medications that need to be held prior to surgery and how long?  ASPIRIN  Practice name and name of physician performing surgery?  [facility]   What is the office phone number?  [phone]   7.   What is the office fax number?  [phone]  8.   Anesthesia type (None, local, MAC, general) ?  PROPOFOL   Jeanann Lewandowsky 04/05/2021, 7:15 AM  _________________________________________________________________   (provider comments below)

## 2021-04-05 NOTE — Telephone Encounter (Signed)
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   May hold ASA as needed for colonoscopy.

## 2021-04-10 NOTE — Telephone Encounter (Signed)
   Primary Cardiologist: [name], MD  Chart reviewed as part of pre-operative protocol coverage. Given past medical history and time since last visit, based on ACC/AHA guidelines, Devin Lynn would be at acceptable risk for the planned procedure without further cardiovascular testing.   His aspirin may be held for 5-7 days prior to his procedure.  Please resume as soon as hemostasis is achieved.  I will route this recommendation to the requesting party via Epic fax function and remove from pre-op pool.  Please call with questions.  Thomasene Ripple. Quinzell Malcomb NP-C    04/10/2021, 2:00 PM [facility] [address] Office [phone] Fax [phone]

## 2021-04-10 NOTE — Telephone Encounter (Signed)
Our office received a duplicate request for procedure coming up on 04/13/21. Looks like pre op provider lvm 9/14.

## 2021-06-22 ENCOUNTER — Telehealth: Payer: Self-pay | Admitting: Pharmacist

## 2021-06-22 NOTE — Telephone Encounter (Signed)
Left VM to reschedule apt due to staffing

## 2021-07-04 ENCOUNTER — Other Ambulatory Visit: Payer: Self-pay

## 2021-07-04 ENCOUNTER — Ambulatory Visit (INDEPENDENT_AMBULATORY_CARE_PROVIDER_SITE_OTHER): Payer: Managed Care, Other (non HMO) | Admitting: Pharmacist

## 2021-07-04 VITALS — BP 156/90 | HR 59

## 2021-07-04 DIAGNOSIS — I10 Essential (primary) hypertension: Secondary | ICD-10-CM | POA: Diagnosis not present

## 2021-07-04 MED ORDER — AMLODIPINE BESYLATE 2.5 MG PO TABS: 2.5000 mg | ORAL_TABLET | Freq: Every day | ORAL | 3 refills | Status: DC

## 2021-07-04 NOTE — Patient Instructions (Addendum)
Please continue taking lisinopril 30mg  daily  START taking amlodipine 2.5mg  daily  Please start checking your blood pressure at home once a day  Please bring your readings and blood pressure cuff to your next appointment  Please call me at [phone] with any questions

## 2021-07-04 NOTE — Progress Notes (Signed)
Patient ID: Devin Lynn                 DOB: May 29, 1956                      MRN: [id number]     HPI: Devin Lynn is a 65 y.o. male referred by Dr. Katrinka Blazing to HTN clinic. PMH is significant for prior abnormal nuclear study suggesting high risk, coronary angiography performed in 2015 demonstrating normal coronary arteries, left ventricular size, and function, hypertension, and family history of CAD.  Has had atypical chest pain in the past which led to the aforementioned work-up. Coronary CTA was preformed in July which showed no flow limiting lesions.  Patient presents to the HTN clinic today. He states that over the last 3 weeks or so his blood pressure has been elevated. He went to his PCP the Monday after thanksgiving and his blood pressure was 184/88. He had has a headache but its since resolved. His lisinopril was increased to 30mg  daily. Does not check his blood pressure at home but does have a BP cuff. He has had trouble sleeping for the past 2.5-3 years. Is going to go to a sleep clinic in Jan. He had started running but stopped when his blood pressure was high. I've seen his wife in HTN clinic about 2 years ago.  Current HTN meds: lisinopril 30mg  daily Previously tried: none BP goal: <130/80  Family History:  The patient's family history includes Diabetes Mellitus II in his father, mother, and sister; HIV/AIDS in his brother.    Social History:   The patient  reports that he has never smoked. He has never used smokeless tobacco. He reports that he does not drink alcohol and does not use drugs.   Diet: no coffee, some tea, no soda consistently, drinks 2% milk, does not eat meat, does eat fish  Exercise: running 1.5 miles (hasn't been consistent with it), walking, mowing the lawn, walking the stairs, sit-up  Home BP readings: 184/88  Wt Readings from Last 3 Encounters:  10/12/19 180 lb (81.6 kg)  02/12/18 179 lb (81.2 kg)  04/14/14 179 lb (81.2 kg)   BP Readings from Last 3 Encounters:   01/26/21 121/77  12/09/20 138/82  10/12/19 128/82   Pulse Readings from Last 3 Encounters:  12/09/20 72  10/12/19 67  04/14/14 (!) 55    Renal function: CrCl cannot be calculated (Patient's most recent lab result is older than the maximum 21 days allowed.).  Past Medical History:  Diagnosis Date   Hypertension    Inguinal hernia, left     Current Outpatient Medications on File Prior to Visit  Medication Sig Dispense Refill   aspirin 81 MG tablet Take 81 mg by mouth daily.     lisinopril (ZESTRIL) 20 MG tablet Take 20 mg by mouth daily.     metoprolol tartrate (LOPRESSOR) 50 MG tablet Take one tablet by mouth 2 hours prior to CT 1 tablet 0   No current facility-administered medications on file prior to visit.    No Known Allergies   Assessment/Plan:  1. Hypertension - Blood pressure is above goal of <130/80 in clinic. I have encouraged patient to resume running/exercise to help with his blood pressure. Continue lisinopril 30mg  daily and start amlodipine 2.5mg  daily. Follow up in 4 weeks (limited availability due to the holidays).  Thank you,  10/14/19, Pharm.D, BCPS, CPP [facility]  [address], [place],  [place] [address]  Phone: [phone]; Fax: [phone]

## 2021-07-06 ENCOUNTER — Ambulatory Visit: Payer: Managed Care, Other (non HMO)

## 2021-07-26 ENCOUNTER — Ambulatory Visit (INDEPENDENT_AMBULATORY_CARE_PROVIDER_SITE_OTHER): Payer: Managed Care, Other (non HMO) | Admitting: Pharmacist

## 2021-07-26 ENCOUNTER — Other Ambulatory Visit: Payer: Self-pay

## 2021-07-26 VITALS — BP 132/86 | HR 66

## 2021-07-26 DIAGNOSIS — I10 Essential (primary) hypertension: Secondary | ICD-10-CM | POA: Diagnosis not present

## 2021-07-26 MED ORDER — AMLODIPINE BESYLATE 5 MG PO TABS: 5.0000 mg | ORAL_TABLET | Freq: Every day | ORAL | 3 refills | Status: DC

## 2021-07-26 NOTE — Patient Instructions (Signed)
Please increase amlodipine to 5mg  daily. Continue lisinopril 30mg  daily. Continue checking your blood pressure at home. Your blood pressure goal is <130/80

## 2021-07-26 NOTE — Progress Notes (Signed)
Patient ID: Devin Lynn                 DOB: [birth year]                      MRN: [id number]     HPI: Sladen Hurd is a 66 y.o. male referred by Dr. Tamala Julian to HTN clinic. PMH is significant for prior abnormal nuclear study suggesting high risk, coronary angiography performed in 2015 demonstrating normal coronary arteries, left ventricular size, and function, hypertension, and family history of CAD.  Has had atypical chest pain in the past which led to the aforementioned work-up. Coronary CTA was preformed in July which showed no flow limiting lesions.  Patient was last seen in HTN clinic on 07/04/21. His blood pressure was 156/90. He was encouraged to resume running/exercising and amlodipine 2.5mg  daily was started. He was asked to start checking his BP at home and bring his cuff to his next appointment.  Patient presents today to clinic. He brings in his hs home blood pressure cuff and home readings with him. His cuff is an upper arm OMRON. He states his headaches have improved and he has fewer of them. Denies SOB or swelling. Some occasionally has some lightheadedness for about 10 seconds. His biggest contributor to his blood pressure he thinks is that he still is not sleeping. Only sleeps about 3 hr a night. Needs to make an appointment with the sleep clinic he was referred to. He is running every 3 days and walking for 30 or more min every day.  Home machine reading 1: 147/90 HR 71 Home machine reading 2:143/85 Clinic reading 1:140/86 Clinic reading: 2 132/86 Home machine reading 3: 138/86   Current HTN meds: lisinopril 30mg  daily, amlodipine 2.5mg  daily Previously tried: none BP goal: <130/80  Family History:  The patient's family history includes Diabetes Mellitus II in his father, mother, and sister; HIV/AIDS in his brother.    Social History:   The patient  reports that he has never smoked. He has never used smokeless tobacco. He reports that he does not drink alcohol and does not use  drugs.   Diet: no coffee, some tea, no soda consistently, drinks 2% milk, does not eat meat, does eat fish  Exercise: running 1.5 miles (every 3 days), walking everyday (at least 30 min), mowing the lawn, walking the stairs, sit-up, rides bike about twice a week  Home BP readings: 139/87, 146/87, 125/85, 125/87, 130/86, 132/79, 135/80, 137/79, 139/95, 141/92, 124/91, 144/97, 135/91, 142/93, 139/95  Wt Readings from Last 3 Encounters:  10/12/19 180 lb (81.6 kg)  02/12/18 179 lb (81.2 kg)  04/14/14 179 lb (81.2 kg)   BP Readings from Last 3 Encounters:  07/04/21 (!) 156/90  01/26/21 121/77  12/09/20 138/82   Pulse Readings from Last 3 Encounters:  07/04/21 (!) 59  12/09/20 72  10/12/19 67    Renal function: CrCl cannot be calculated (Patient's most recent lab result is older than the maximum 21 days allowed.).  Past Medical History:  Diagnosis Date   Hypertension    Inguinal hernia, left     Current Outpatient Medications on File Prior to Visit  Medication Sig Dispense Refill   amLODipine (NORVASC) 2.5 MG tablet Take 1 tablet (2.5 mg total) by mouth daily. 90 tablet 3   aspirin 81 MG tablet Take 81 mg by mouth daily.     lisinopril (ZESTRIL) 20 MG tablet Take 30 mg by mouth daily.  metoprolol tartrate (LOPRESSOR) 50 MG tablet Take one tablet by mouth 2 hours prior to CT 1 tablet 0   No current facility-administered medications on file prior to visit.    No Known Allergies   Assessment/Plan:  1. Hypertension - Blood pressure is above goal of <130/80 in clinic. Blood pressure is improved. His home cuff is fairly accurate. Running about 6 points high for systolic. Diastolic is accurate. Will increase amlodipine to 5mg  daily. Continue lisinopril 30mg  daily. Patient would like to lower the dose of lisinopril. I advised that lets first increase amlodipine to 5mg  (to avoid LLE). If we see good results, we can increase to 10mg  and then decrease lisinopril next time.  Follow  up in 4 weeks.   Thank you,  Ramond Dial, Pharm.D, BCPS, CPP [place]  [id number], [place], [place] [address]  Phone: [phone]; Fax: [phone]

## 2021-07-31 ENCOUNTER — Ambulatory Visit: Payer: Managed Care, Other (non HMO)

## 2021-08-25 ENCOUNTER — Other Ambulatory Visit: Payer: Self-pay

## 2021-08-25 ENCOUNTER — Ambulatory Visit (INDEPENDENT_AMBULATORY_CARE_PROVIDER_SITE_OTHER): Payer: Managed Care, Other (non HMO) | Admitting: Pharmacist

## 2021-08-25 VITALS — BP 134/82 | HR 61

## 2021-08-25 DIAGNOSIS — I10 Essential (primary) hypertension: Secondary | ICD-10-CM | POA: Diagnosis not present

## 2021-08-25 MED ORDER — AMLODIPINE BESYLATE 5 MG PO TABS: 5.0000 mg | ORAL_TABLET | Freq: Every day | ORAL | 3 refills | Status: DC

## 2021-08-25 NOTE — Patient Instructions (Addendum)
Start varying the time you check your blood pressure. Continue lisinopril 30mg  daily and amlodipine 5mg  daily  Call me at [phone] with any questions.

## 2021-08-25 NOTE — Progress Notes (Signed)
Patient ID: Devin Lynn                 DOB: [birth year]                      MRN: [id number]     HPI: Devin Lynn is a 66 y.o. male referred by Dr. Katrinka Blazing to HTN clinic. PMH is significant for prior abnormal nuclear study suggesting high risk, coronary angiography performed in 2015 demonstrating normal coronary arteries, left ventricular size, and function, hypertension, and family history of CAD.  Has had atypical chest pain in the past which led to the aforementioned work-up. Coronary CTA was preformed in July which showed no flow limiting lesions.  Patient was seen in HTN clinic on 07/04/21. His blood pressure was 156/90. He was encouraged to resume running/exercising and amlodipine 2.5mg  daily was started. At last visit on 07/26/21 blood pressure was improved at 132/86. Amlodipine was increased to 5mg  daily. Patient expressed a desire to lower lisinopril dose. We discussed the potential to do this depending on his response to amlodipine titration. His home cuff was found to be fairly accurate- about 6 points high for systolic.   Patient presents today to HTN clinic. He states that he is feeling great. Excited about his blood pressures at home. He has been exercising. Denies any headaches or swelling. Some lightheadedness only if he changes positions abruptly. Still has not called to schedule with sleep clinic. Does state that he is sleeping better and a little longer, but this does not sound like he still really gets much sleep at all. States he is feeling more tranquil.   He has been checking his BP post exercise (within 5-35 min).  Home machine reading 1: 147/90 HR 71 Home machine reading 2:143/85 Clinic reading 1:140/86 Clinic reading: 2 132/86 Home machine reading 3: 138/86   Current HTN meds: lisinopril 30mg  daily, amlodipine 5mg  daily Previously tried: none BP goal: <130/80  Family History:  The patient's family history includes Diabetes Mellitus II in his father, mother, and sister;  HIV/AIDS in his brother.    Social History:   The patient  reports that he has never smoked. He has never used smokeless tobacco. He reports that he does not drink alcohol and does not use drugs.   Diet: no coffee, some tea, no soda consistently, drinks 2% milk, does not eat meat, does eat fish  Exercise: running 1.5 miles (every 3 days), walking everyday (at least 30 min), mowing the lawn, walking the stairs, sit-up, rides bike about twice a week  Home BP readings: 125/70, 118/68, 140/72, 128/66, 120/72, 127/77, 117/67, 118/67, 121/71, 124/74  Wt Readings from Last 3 Encounters:  10/12/19 180 lb (81.6 kg)  02/12/18 179 lb (81.2 kg)  04/14/14 179 lb (81.2 kg)   BP Readings from Last 3 Encounters:  07/26/21 132/86  07/04/21 (!) 156/90  01/26/21 121/77   Pulse Readings from Last 3 Encounters:  07/26/21 66  07/04/21 (!) 59  12/09/20 72    Renal function: CrCl cannot be calculated (Patient's most recent lab result is older than the maximum 21 days allowed.).  Past Medical History:  Diagnosis Date   Hypertension    Inguinal hernia, left     Current Outpatient Medications on File Prior to Visit  Medication Sig Dispense Refill   amLODipine (NORVASC) 5 MG tablet Take 1 tablet (5 mg total) by mouth daily. 90 tablet 3   aspirin 81 MG tablet Take 81 mg by mouth  daily.     lisinopril (ZESTRIL) 20 MG tablet Take 30 mg by mouth daily.     metoprolol tartrate (LOPRESSOR) 50 MG tablet Take one tablet by mouth 2 hours prior to CT 1 tablet 0   No current facility-administered medications on file prior to visit.    No Known Allergies   Assessment/Plan:  1. Hypertension - Blood pressure is above goal of <130/80 in clinic. Blood pressure is improved and is better at home. However, all of his readings are done directly post exercise. I have asked him to check his blood pressure at home at varying times of the day and avoid checking right after exercise. Continue amlodipine 5mg  daily and  lisinopril 30mg  daily. Follow up in 4 weeks.    Thank you,  , Pharm.D, BCPS, CPP [facility]  [address], [place], [address] [place]  Phone: [phone]; Fax: [phone]

## 2021-09-22 ENCOUNTER — Ambulatory Visit: Payer: Managed Care, Other (non HMO)

## 2021-09-22 NOTE — Progress Notes (Deleted)
Patient ID: Devin Lynn                 DOB: 1955-11-24                      MRN: [id number] ? ? ? ? ?HPI: ?Devin Lynn is a 66 y.o. male referred by Dr. Tamala Julian to HTN clinic. PMH is significant for prior abnormal nuclear study suggesting high risk, coronary angiography performed in 2015 demonstrating normal coronary arteries, left ventricular size, and function, hypertension, and family history of CAD.  Has had atypical chest pain in the past which led to the aforementioned work-up. Coronary CTA was preformed in July which showed no flow limiting lesions. ? ?Patient was seen in HTN clinic on 07/04/21. His blood pressure was 156/90. He was encouraged to resume running/exercising and amlodipine 2.5mg  daily was started. On 07/26/21 visit, blood pressure was improved at 132/86. Amlodipine was increased to 5mg  daily. Patient expressed a desire to lower lisinopril dose. At last visit, home blood pressures were at goal. However, all his readings were within 35 min of exercise (most within just a few min of exercise). I asked him to vary the time he checks his blood pressure. No medication changes were made. His home cuff was previously found to be fairly accurate- about 6 points high for systolic.  ? ?Dizziness, lightheadedness, headache, blurred vision, SOB, swelling ?Home bp- timing? ?Sleep clinic? ? ?Patient presents today to HTN clinic. He states that he is feeling great. Excited about his blood pressures at home. He has been exercising. Denies any headaches or swelling. Some lightheadedness only if he changes positions abruptly. Still has not called to schedule with sleep clinic. Does state that he is sleeping better and a little longer, but this does not sound like he still really gets much sleep at all. States he is feeling more tranquil.  ? ?He has been checking his BP post exercise (within 5-35 min). ? ?Home machine reading 1: 147/90 HR 71 ?Home machine reading 2:143/85 ?Clinic reading 1:140/86 ?Clinic reading: 2  132/86 ?Home machine reading 3: 138/86 ? ? ?Current HTN meds: lisinopril 30mg  daily, amlodipine 5mg  daily ?Previously tried: none ?BP goal: <130/80 ? ?Family History:  ?The patient's family history includes Diabetes Mellitus II in his father, mother, and sister; HIV/AIDS in his brother.  ?  ?Social History:   The patient  reports that he has never smoked. He has never used smokeless tobacco. He reports that he does not drink alcohol and does not use drugs.  ? ?Diet: no coffee, some tea, no soda consistently, drinks 2% milk, does not eat meat, does eat fish ? ?Exercise: running 1.5 miles (every 3 days), walking everyday (at least 30 min), mowing the lawn, walking the stairs, sit-up, rides bike about twice a week ? ?Home BP readings: 125/70, 118/68, 140/72, 128/66, 120/72, 127/77, 117/67, 118/67, 121/71, 124/74 ? ?Wt Readings from Last 3 Encounters:  ?10/12/19 180 lb (81.6 kg)  ?02/12/18 179 lb (81.2 kg)  ?04/14/14 179 lb (81.2 kg)  ? ?BP Readings from Last 3 Encounters:  ?08/25/21 134/82  ?07/26/21 132/86  ?07/04/21 (!) 156/90  ? ?Pulse Readings from Last 3 Encounters:  ?08/25/21 61  ?07/26/21 66  ?07/04/21 (!) 59  ? ? ?Renal function: ?CrCl cannot be calculated (Patient's most recent lab result is older than the maximum 21 days allowed.). ? ?Past Medical History:  ?Diagnosis Date  ? Hypertension   ? Inguinal hernia, left   ? ? ?Current  Outpatient Medications on File Prior to Visit  ?Medication Sig Dispense Refill  ? amLODipine (NORVASC) 5 MG tablet Take 1 tablet (5 mg total) by mouth daily. 90 tablet 3  ? aspirin 81 MG tablet Take 81 mg by mouth daily.    ? lisinopril (ZESTRIL) 20 MG tablet Take 30 mg by mouth daily.    ? metoprolol tartrate (LOPRESSOR) 50 MG tablet Take one tablet by mouth 2 hours prior to CT 1 tablet 0  ? ?No current facility-administered medications on file prior to visit.  ? ? ?No Known Allergies ? ? ?Assessment/Plan: ? ?1. Hypertension - Blood pressure is above goal of <130/80 in clinic. Blood  pressure is improved and is better at home. However, all of his readings are done directly post exercise. I have asked him to check his blood pressure at home at varying times of the day and avoid checking right after exercise. Continue amlodipine 5mg  daily and lisinopril 30mg  daily. Follow up in 4 weeks.  ?  ?Thank you, ? ?Ramond Dial, Pharm.D, BCPS, CPP ?[id number], [place],  [address]  ?Phone: [phone]; Fax: [phone]  ? ? ?

## 2021-10-02 ENCOUNTER — Telehealth: Payer: Self-pay | Admitting: Pharmacist

## 2021-10-02 NOTE — Telephone Encounter (Signed)
Patient missed apt with me last week. Called pt to follow up and reschedule. LVM ?

## 2021-10-18 ENCOUNTER — Telehealth: Payer: Self-pay

## 2021-10-18 NOTE — Telephone Encounter (Signed)
Lmom to R/s pharmd missed appt  ?

## 2021-10-19 NOTE — Telephone Encounter (Signed)
Called and was able to r/s appt that they missed.  ?

## 2021-10-19 NOTE — Telephone Encounter (Signed)
Patient called and LVM to reschedule his missed apt.  ?Phone # he left was [phone] ?

## 2021-11-14 ENCOUNTER — Ambulatory Visit (INDEPENDENT_AMBULATORY_CARE_PROVIDER_SITE_OTHER): Payer: Managed Care, Other (non HMO)

## 2021-11-14 VITALS — BP 140/80 | HR 64

## 2021-11-14 DIAGNOSIS — I10 Essential (primary) hypertension: Secondary | ICD-10-CM

## 2021-11-14 MED ORDER — AMLODIPINE BESYLATE 10 MG PO TABS: 10.0000 mg | ORAL_TABLET | Freq: Every day | ORAL | 3 refills | Status: DC

## 2021-11-14 NOTE — Patient Instructions (Addendum)
Your blood pressure goal is less than 130/80. ? ?Continue to rest 5-10 minutes before checking your blood pressure at home and keep a log. ? ?Continue exercising and try adding resistance training to your routine.  ? ?START taking amlodipine 10 mg. You can take two of the 5 mg tablets until you run out. We will send a prescription for the 10 mg dose to your [facility]. ? ?We will see you back in clinic in one month. ?

## 2021-11-14 NOTE — Progress Notes (Signed)
Patient ID: Devin Lynn                 DOB: [birth year]                      MRN: [id number]     HPI: Zandyr Barnhill is a 66 y.o. male referred by Dr. Katrinka Blazing to HTN clinic. PMH is significant for prior abnormal nuclear study suggesting high risk, coronary angiography performed in 2015 demonstrating normal coronary arteries, left ventricular size, and function, hypertension, and family history of CAD.  Has had atypical chest pain in the past which led to the aforementioned work-up. Coronary CTA preformed in July 2022 showed no flow limiting lesions.   Patient was seen in HTN clinic on 07/04/21. His blood pressure was 156/90. He was encouraged to resume running/exercising and amlodipine 2.5mg  daily was started. At last visit on 07/26/21 blood pressure was improved at 132/86. Amlodipine was increased to 5mg  daily. Patient expressed a desire to lower lisinopril dose. Discussed the potential to do this depending on his response to amlodipine titration. His home cuff was found to be fairly accurate- about 6 points high for systolic. Blood pressure 134/82 still above goal at 08/25/21 PharmD visit but home readings at goal < 130/80. Continued on lisinopril 30 mg and amlodipine 5 mg daily.  Patient presents today to HTN clinic in good spirits. States that he's happy with his blood pressures at home - mostly at goal with systolic 130-40s. Says he doesn't rest before taking BP, but did start taking measurements throughout the day instead of right after exercising. BP in clinic elevated 140/80 but reports having anxiety when coming in for doctor visits. Denies headaches, dizziness. Reports some hand swelling but denies any in lower extremities. He continues to exercise. Still has not called to schedule with sleep clinic. Says that he's getting an abundant amount of sleep for him, 3.5 hours. Reports that he is going to start drinking only water to avoid the need for dialysis.   Current HTN meds:  Lisinopril 30mg  daily (PM -  midnight) Amlodipine 5mg  daily (PM)  Previously tried: none BP goal: <130/80  Family History:  The patient's family history includes Diabetes Mellitus II in his father, mother, and sister; HIV/AIDS in his brother.    Social History:   The patient reports that he has never smoked. He has never used smokeless tobacco. He reports that he does not drink alcohol and does not use drugs.   Diet: no coffee, some tea, no soda consistently, drinks 2% milk, does not eat meat, does eat fish. Says he has a slice or two of pizza on occasion.   Exercise: Run one day, then walk three days. Running 1.5 miles (every 3 days), walking everyday (at least 30 min), mowing the lawn, walking the stairs, sit-up, rides bike about twice a week  Home BP readings: 122/74, 130/76, 140/77, 133/76, 131/76 08/25/2021: 125/70, 118/68, 140/72, 128/66, 120/72, 127/77, 117/67, 118/67, 121/71, 124/74  Wt Readings from Last 3 Encounters:  10/12/19 180 lb (81.6 kg)  02/12/18 179 lb (81.2 kg)  04/14/14 179 lb (81.2 kg)   BP Readings from Last 3 Encounters:  08/25/21 134/82  07/26/21 132/86  07/04/21 (!) 156/90   Pulse Readings from Last 3 Encounters:  08/25/21 61  07/26/21 66  07/04/21 (!) 59    Renal function: CrCl cannot be calculated (Patient's most recent lab result is older than the maximum 21 days allowed.).  Past Medical History:  Diagnosis  Date   Hypertension    Inguinal hernia, left     Current Outpatient Medications on File Prior to Visit  Medication Sig Dispense Refill   amLODipine (NORVASC) 5 MG tablet Take 1 tablet (5 mg total) by mouth daily. 90 tablet 3   aspirin 81 MG tablet Take 81 mg by mouth daily.     lisinopril (ZESTRIL) 20 MG tablet Take 30 mg by mouth daily.     metoprolol tartrate (LOPRESSOR) 50 MG tablet Take one tablet by mouth 2 hours prior to CT 1 tablet 0   No current facility-administered medications on file prior to visit.    No Known Allergies   Assessment/Plan:  1.  Hypertension - Blood pressure is above goal of <130/80 in clinic. Blood pressure is better at home with a few readings with systolic 130-40. Reminded patient to rest 5-10 minutes before taking BP at home. Patient asked about going down to one medication. Discussed that it is a possibility once his blood pressure is more consistently below 130/80 alongside additional weight loss and continued dietary changes. Encouraged patient to add resistance training into exercise routine. Patient agreed with increasing amlodipine to 10 mg daily and continuing lisinopril 30 mg daily. Sent new prescription to pharmacy, scheduled follow up in 4 weeks.   Thank you,  Carleene Overlie, PharmD PGY1 Pharmacy Resident  Olene Floss, Pharm.D, BCPS, CPP [facility]  [address] [place], [place] [address]  Phone: [phone]; Fax: [phone]

## 2021-12-06 ENCOUNTER — Ambulatory Visit (INDEPENDENT_AMBULATORY_CARE_PROVIDER_SITE_OTHER): Payer: Managed Care, Other (non HMO) | Admitting: Pharmacist

## 2021-12-06 VITALS — BP 128/74 | HR 68

## 2021-12-06 DIAGNOSIS — I10 Essential (primary) hypertension: Secondary | ICD-10-CM

## 2021-12-06 NOTE — Patient Instructions (Addendum)
Continue taking Lisinopril 30mg  daily and Amlodipine 10mg  daily ?Keep up with the exercise, add in some strength training ?Great work! ?Keep up with drinking just water! ? ?Call me at [phone] if you need me ?

## 2021-12-06 NOTE — Progress Notes (Signed)
Patient ID: Devin Lynn                 DOB: July 10, 1956                      MRN: [id number]     HPI: Devin Lynn is a 66 y.o. male referred by Dr. Katrinka Blazing to HTN clinic. PMH is significant for prior abnormal nuclear study suggesting high risk, coronary angiography performed in 2015 demonstrating normal coronary arteries, left ventricular size, and function, hypertension, and family history of CAD.  Has had atypical chest pain in the past which led to the aforementioned work-up. Coronary CTA preformed in July 2022 showed no flow limiting lesions.   Patient was seen in HTN clinic on 07/04/21. His blood pressure was 156/90. He was encouraged to resume running/exercising and amlodipine 2.5mg  daily was started. At last visit on 07/26/21 blood pressure was improved at 132/86. Amlodipine was increased to 5mg  daily. Patient expressed a desire to lower lisinopril dose. Discussed the potential to do this depending on his response to amlodipine titration. His home cuff was found to be fairly accurate- about 6 points high for systolic. Blood pressure 134/82 still above goal at 08/25/21 PharmD visit but home readings at goal < 130/80. Continued on lisinopril 30 mg and amlodipine 5 mg daily.  At last appointment on 11/14/21, home blood pressures were in the 130's/70's mainly. Amlodipine was increased to 10mg  daily and patient was encouraged to add resistance training into his exercise routine.  Patient presents today to clinic. He is very excited about the readings he is getting at home. Denies and dizziness unless he stands up too quick. He is feeling a lot better and sleeping better. Getting about 5hr of sleep total per night which is improved from 3.5hr. He is no longer hearing his heart beat in his ears when he is trying to sleep. He has stopped drinking soda and tea. Drinking only water with a little bit of OJ now and again. States mentally he is in a better place and doesn't fear checking his BP.  Current HTN meds:   Lisinopril 30mg  daily (PM - midnight) Amlodipine 10mg  daily (PM)  Previously tried: none BP goal: <130/80  Family History:  The patient's family history includes Diabetes Mellitus II in his father, mother, and sister; HIV/AIDS in his brother.    Social History:   The patient reports that he has never smoked. He has never used smokeless tobacco. He reports that he does not drink alcohol and does not use drugs.   Diet: no coffee, water and only a little OJ does not eat meat, does eat fish. Says he has a slice or two of pizza on occasion.   Exercise: running and walking 2.75 miles, mowing the lawn, walking the stairs, sit-up, rides bike about twice a week  Home BP readings: 125/73, 128/79, 120/79, 127/86, 121/75, 127/77, 120/75, 117/71, 128/81, 116/77, 105/69, 125/79, 122/81, 131/76 11/14/21: 122/74, 130/76, 140/77, 133/76, 131/76 08/25/2021: 125/70, 118/68, 140/72, 128/66, 120/72, 127/77, 117/67, 118/67, 121/71, 124/74  Wt Readings from Last 3 Encounters:  10/12/19 180 lb (81.6 kg)  02/12/18 179 lb (81.2 kg)  04/14/14 179 lb (81.2 kg)   BP Readings from Last 3 Encounters:  11/14/21 140/80  08/25/21 134/82  07/26/21 132/86   Pulse Readings from Last 3 Encounters:  11/14/21 64  08/25/21 61  07/26/21 66    Renal function: CrCl cannot be calculated (Patient's most recent lab result is older than the  maximum 21 days allowed.).  Past Medical History:  Diagnosis Date   Hypertension    Inguinal hernia, left     Current Outpatient Medications on File Prior to Visit  Medication Sig Dispense Refill   amLODipine (NORVASC) 10 MG tablet Take 1 tablet (10 mg total) by mouth daily. 90 tablet 3   aspirin 81 MG tablet Take 81 mg by mouth daily.     lisinopril (ZESTRIL) 20 MG tablet Take 30 mg by mouth daily.     metoprolol tartrate (LOPRESSOR) 50 MG tablet Take one tablet by mouth 2 hours prior to CT 1 tablet 0   No current facility-administered medications on file prior to visit.     No Known Allergies   Assessment/Plan:  1. Hypertension - Blood pressure is at goal of <130/80 in clinic. Continue taking Lisinopril 30mg  daily and Amlodipine 10mg  daily. Patient encouraged to continue with his exercise and add in some strength training. He is looking into joining the [facility]. Follow up as needed.   Thank you,  , Pharm.D, BCPS, CPP [facility]  [address], [place], [address] [place]  Phone: [phone]; Fax: [phone]

## 2021-12-08 ENCOUNTER — Ambulatory Visit: Payer: Managed Care, Other (non HMO)

## 2022-01-19 IMAGING — CR DG NASAL BONES 3+V
3 series · 3 of 3 positions shown · non-contrast
Comparison: None.

CLINICAL DATA: Trauma 4 days ago, left-sided nasal pain

EXAM:
NASAL BONES - 3+ VIEW

[w waters pa]
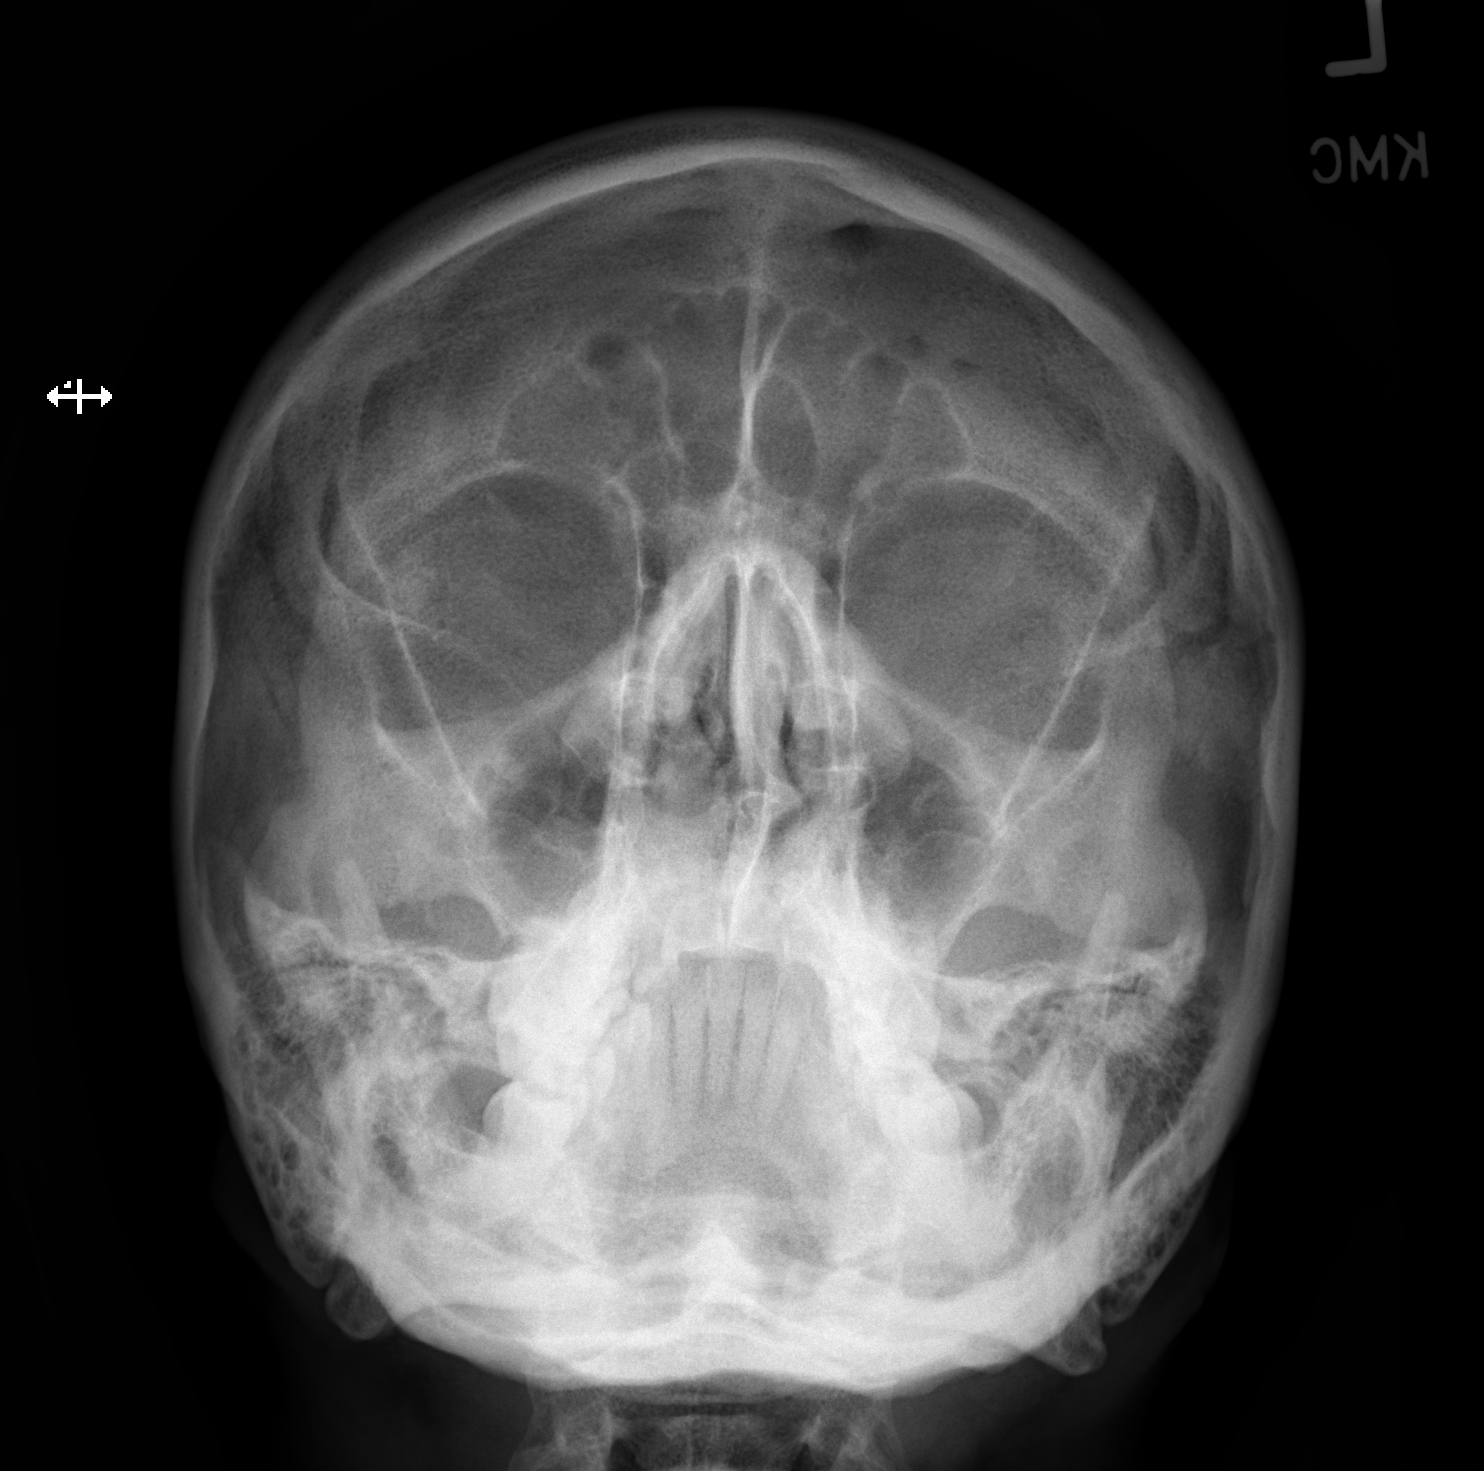

[w nasal bone lat (1 of 2)]
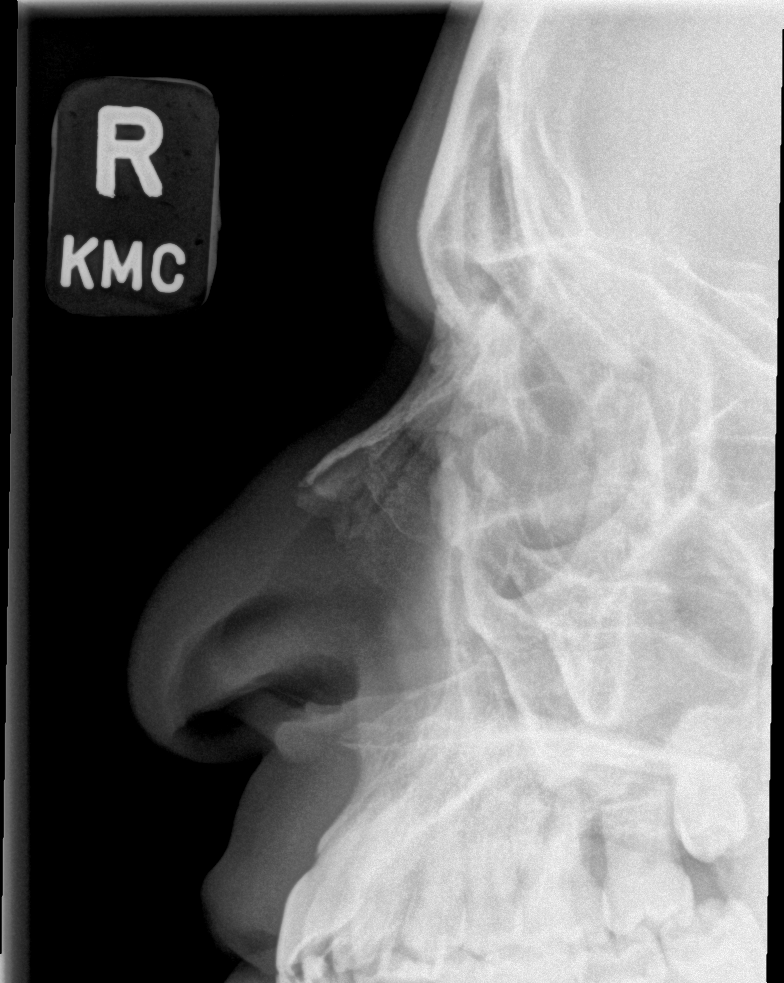

[w nasal bone lat (2 of 2)]
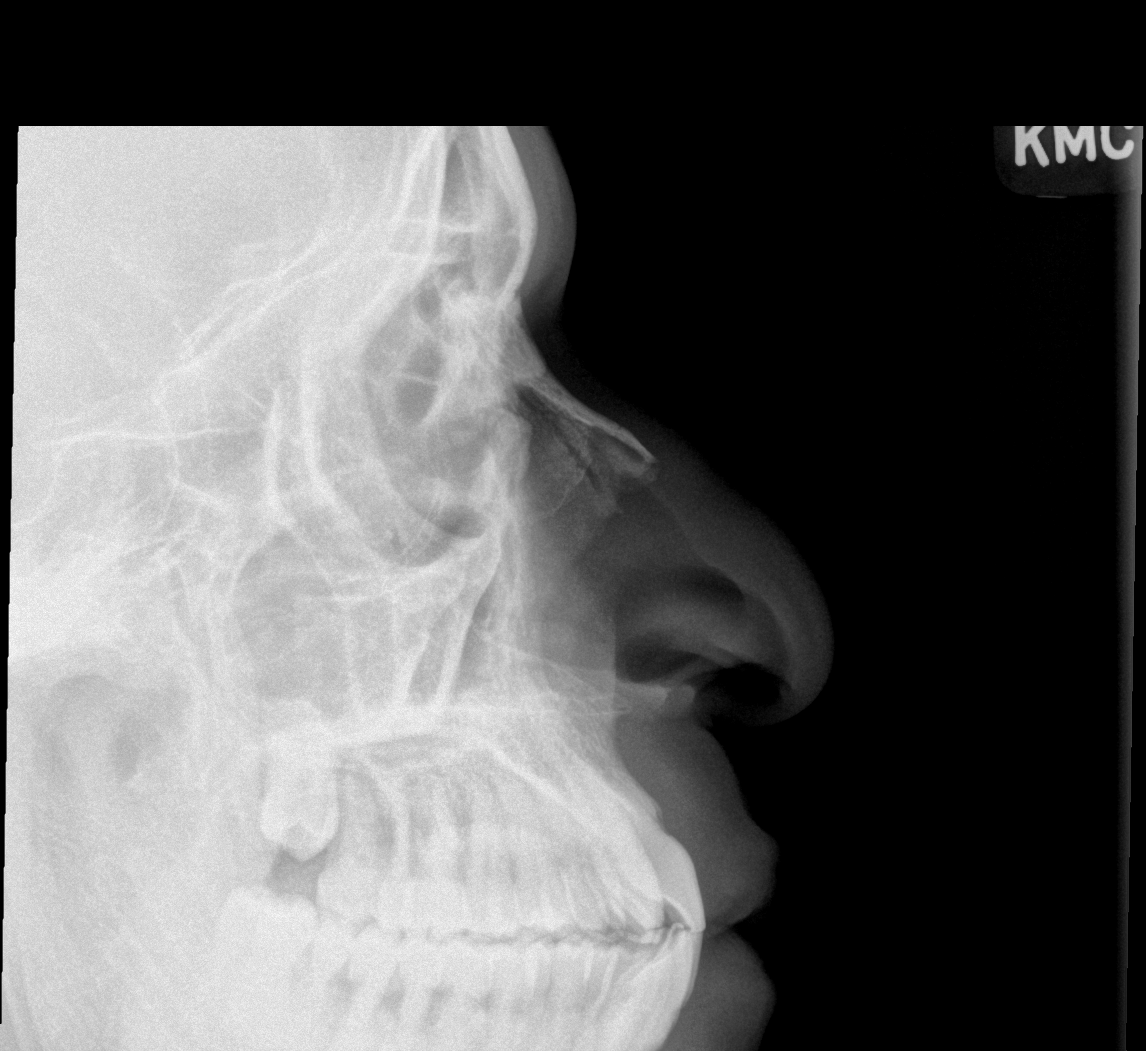

[3 of 3 positions shown; findings below may reference images not displayed]

FINDINGS: Frontal and bilateral lateral views of the nasal bones are obtained.
There are no acute displaced fractures. Alignment is anatomic. Nasal
septum deviates to the left. Sinuses are clear.
IMPRESSION: 1. No acute bony abnormality.

## 2022-05-28 ENCOUNTER — Ambulatory Visit: Payer: Managed Care, Other (non HMO) | Attending: Interventional Cardiology | Admitting: Interventional Cardiology

## 2022-05-28 ENCOUNTER — Encounter: Payer: Self-pay | Admitting: Interventional Cardiology

## 2022-05-28 ENCOUNTER — Ambulatory Visit: Payer: Managed Care, Other (non HMO) | Admitting: Interventional Cardiology

## 2022-05-28 VITALS — BP 136/84 | HR 64 | Ht 69.0 in | Wt 179.5 lb

## 2022-05-28 DIAGNOSIS — E7849 Other hyperlipidemia: Secondary | ICD-10-CM | POA: Diagnosis not present

## 2022-05-28 DIAGNOSIS — I2510 Atherosclerotic heart disease of native coronary artery without angina pectoris: Secondary | ICD-10-CM | POA: Diagnosis not present

## 2022-05-28 DIAGNOSIS — I10 Essential (primary) hypertension: Secondary | ICD-10-CM

## 2022-05-28 MED ORDER — ROSUVASTATIN CALCIUM 5 MG PO TABS: 5.0000 mg | ORAL_TABLET | Freq: Every day | ORAL | 3 refills | Status: DC

## 2022-05-28 MED ORDER — HYDROCHLOROTHIAZIDE 12.5 MG PO CAPS: 12.5000 mg | ORAL_CAPSULE | Freq: Every day | ORAL | 3 refills | Status: DC

## 2022-05-28 NOTE — Progress Notes (Signed)
Cardiology Office Note:    Date:  05/28/2022   ID:  Devin Lynn, DOB Aug 21, 1955, MRN [id number]  PCP:  Jilda Panda, MD  Cardiologist:  Sinclair Grooms, MD   Referring MD: Jilda Panda, MD   Chief Complaint  Patient presents with   Coronary Artery Disease   Hyperlipidemia   Hypertension    History of Present Illness:    Devin Lynn is a 66 y.o. male with a hx of abnormal nuclear study suggesting high risk, coronary angiography performed in 2015 demonstrating normal coronary arteries, left ventricular size, and function, hypertension, and family history of CAD.  Has had atypical chest pain in the past which led to the aforementioned work-up.   Obstructive coronary disease evaluated/identified in 2022.  Not currently on statin therapy.  Risk factors include hypertension.  No chest discomfort of different nature than what he has had over the years.  His EKG reveals left anterior hemiblock.  This has been noted for quite some time.  Amlodipine is causing uncomfortable lower extremity swelling.  Past Medical History:  Diagnosis Date   Hypertension    Inguinal hernia, left     Past Surgical History:  Procedure Laterality Date   CIRCUMCISION  1974   LEFT HEART CATHETERIZATION WITH CORONARY ANGIOGRAM N/A 04/14/2014   Procedure: LEFT HEART CATHETERIZATION WITH CORONARY ANGIOGRAM;  Surgeon: Sinclair Grooms, MD;  Location: [facility] CATH LAB;  Service: Cardiovascular;  Laterality: N/A;    Current Medications: Current Meds  Medication Sig   amLODipine (NORVASC) 10 MG tablet Take 1 tablet (10 mg total) by mouth daily.   aspirin 81 MG tablet Take 81 mg by mouth daily.   lisinopril (ZESTRIL) 20 MG tablet Take 30 mg by mouth daily.     Allergies:   Patient has no known allergies.   Social History   Socioeconomic History   Marital status: Married    Spouse name: Not on file   Number of children: Not on file   Years of education: Not on file   Highest education level: Not on file   Occupational History   Not on file  Tobacco Use   Smoking status: Never   Smokeless tobacco: Never  Substance and Sexual Activity   Alcohol use: No   Drug use: No   Sexual activity: Yes  Other Topics Concern   Not on file  Social History Narrative   Not on file   Social Determinants of Health   Financial Resource Strain: Not on file  Food Insecurity: Not on file  Transportation Needs: Not on file  Physical Activity: Not on file  Stress: Not on file  Social Connections: Not on file     Family History: The patient's family history includes Diabetes Mellitus II in his father, mother, and sister; HIV/AIDS in his brother.  ROS:   Please see the history of present illness.    No new complaints all other systems reviewed and are negative.  EKGs/Labs/Other Studies Reviewed:    The following studies were reviewed today: Coronary CTA July 2022: Dr. Fransico Him: MPRESSION: 1. Coronary calcium score of 268. This was 86th percentile for age and sex matched control.   2.  Normal coronary origin with right dominance.   3.  Mild atherosclerosis.  CAD RADS 2.   4.  Consider non atherosclerotic causes of chest pain.   5.  Recommend preventive therapy and risk factor modification.   6.  FFR did not reveal any hemodynamically significant lesion  EKG:  EKG sinus rhythm with left anterior hemiblock.  Recent Labs: No results found for requested labs within last 365 days.  Recent Lipid Panel No results found for: "CHOL", "TRIG", "HDL", "CHOLHDL", "VLDL", "LDLCALC", "LDLDIRECT"  Physical Exam:    VS:  BP 136/84   Pulse 64   Ht _0  (1.753 m)   Wt 179 lb 8 oz (81.4 kg)   SpO2 98%   BMI 26.51 kg/m     Wt Readings from Last 3 Encounters:  05/28/22 179 lb 8 oz (81.4 kg)  10/12/19 180 lb (81.6 kg)  02/12/18 179 lb (81.2 kg)     GEN: Healthy appearing, good body weight. No acute distress HEENT: Normal NECK: No JVD. LYMPHATICS: No lymphadenopathy CARDIAC: No murmur.  RRR no gallop, or edema. VASCULAR:  Normal Pulses. No bruits. RESPIRATORY:  Clear to auscultation without rales, wheezing or rhonchi  ABDOMEN: Soft, non-tender, non-distended, No pulsatile mass, MUSCULOSKELETAL: No deformity  SKIN: Warm and dry NEUROLOGIC:  Alert and oriented x 3 PSYCHIATRIC:  Normal affect   ASSESSMENT:    1. CAD in native artery   2. Other hyperlipidemia   3. Primary hypertension    PLAN:    In order of problems listed above:  Milligrams per day and start low-dose statin therapy in the form of rosuvastatin 5 mg/day.  Most recent LDL cholesterol was 98 in August 2023.  Target should be less than 70.  C-Met and lipid panel in 4 weeks. See above Target blood pressure 130/80.  Because of lower extremity swelling with amlodipine, the medication will be discontinued and HCTZ 12.5 mg will be started.  Follow-up in blood pressure clinic.  May need to further increase the dose of lisinopril although he seems to have some upper respiratory irritation and perhaps an ARB would be a better choice.   Target BP: <130/80 mmHg  Diet and lifestyle measures for BP control were reviewed in detail: Low sodium diet (<2.5 gm daily); alcohol restriction (<3 ounces per day); weight loss (Mediterranean); avoid non-steroidal agents; > 6 hours sleep per day; 150 min moderate exercise per week. Medical regimen will include at least 2 agents. Resistant hypertension if not controlled on 3 agents. Consider further evaluation: Sleep study to r/o OSA; Renal angiogram; Primary hyperaldonism and Pheochromocytoma w/u. After 3 agents, consider MRA (spironolactone)/ Epleronone), hydralazine, beta-blocker, and Minoxidil if not already in use due to patient profile.  Needs general cardiology follow-up in 1 year.  Blood pressure clinic in 4 weeks.  Lipid and c-Met at that time.  Medication Adjustments/Labs and Tests Ordered: Current medicines are reviewed at length with the patient today.  Concerns  regarding medicines are outlined above.  No orders of the defined types were placed in this encounter.  No orders of the defined types were placed in this encounter.   Patient Instructions  Medication Instructions:  Your physician has recommended you make the following change in your medication:   1) STOP amlodipine 2) START rosuvastatin (Crestor) 68m daily 3) START hydrochlorothiazide (HCTZ) 12.527mdaily  *If you need a refill on your cardiac medications before your next appointment, please call your pharmacy*  Lab Work: In 1 month: fasting lipid panel and CMET If you have labs (blood work) drawn today and your tests are completely normal, you will receive your results only by: [place] have [place]Chart) OR A paper copy in the mail If you have any lab test that is abnormal or we need to change your treatment, we will call  you to review the results.  Testing/Procedures: NONE  Follow-Up: At [facility], you and your health needs are our priority.  As part of our continuing mission to provide you with exceptional heart care, we have created designated Provider Care Teams.  These Care Teams include your primary Cardiologist (physician) and Advanced Practice Providers (APPs -  Physician Assistants and Nurse Practitioners) who all work together to provide you with the care you need, when you need it.  Your next appointment:   1 month(s) with pharmacist in HTN/BP Clinic 1 year with general cardiologist  The format for your next appointment:   In Person  Provider:   Sinclair Grooms, MD   Other Instructions Please monitor your blood pressure at home, checking blood pressure at least 2 hours after taking medications. Your goal is a reading less than 140/90 mmHg, ideal blood pressure target is 130/80 mmHg or less.   Important Information About Sugar         Signed, Sinclair Grooms, MD  05/28/2022 4:08 PM    [facility]

## 2022-05-28 NOTE — Patient Instructions (Addendum)
Medication Instructions:  Your physician has recommended you make the following change in your medication:   1) STOP amlodipine 2) START rosuvastatin (Crestor) 5mg  daily 3) START hydrochlorothiazide (HCTZ) 12.5mg  daily  *If you need a refill on your cardiac medications before your next appointment, please call your pharmacy*  Lab Work: In 1 month: fasting lipid panel and CMET (same day as BP clinic appointment) If you have labs (blood work) drawn today and your tests are completely normal, you will receive your results only by: [place] (if you have MyChart) OR A paper copy in the mail If you have any lab test that is abnormal or we need to change your treatment, we will call you to review the results.  Testing/Procedures: NONE  Follow-Up: At [facility], you and your health needs are our priority.  As part of our continuing mission to provide you with exceptional heart care, we have created designated Provider Care Teams.  These Care Teams include your primary Cardiologist (physician) and Advanced Practice Providers (APPs -  Physician Assistants and Nurse Practitioners) who all work together to provide you with the care you need, when you need it.  Your next appointment:   1 month(s) with pharmacist in HTN/BP Clinic 1 year with general cardiologist  The format for your next appointment:   In Person  Provider:   Sinclair Grooms, MD   Other Instructions Please monitor your blood pressure at home, checking blood pressure at least 2 hours after taking medications. Your goal is a reading less than 140/90 mmHg, ideal blood pressure target is 130/80 mmHg or less.   Important Information About Sugar

## 2022-06-12 ENCOUNTER — Ambulatory Visit: Payer: Managed Care, Other (non HMO) | Admitting: Interventional Cardiology

## 2022-07-10 ENCOUNTER — Ambulatory Visit: Payer: Managed Care, Other (non HMO)

## 2022-07-10 ENCOUNTER — Other Ambulatory Visit: Payer: Managed Care, Other (non HMO)

## 2022-07-31 ENCOUNTER — Telehealth: Payer: Self-pay | Admitting: Pharmacist

## 2022-07-31 NOTE — Telephone Encounter (Signed)
Patient called stating that ever since his amlodipine was stopped that he cannot get his blood pressure out of the 140s.  He has increased his exercise, eliminated all caffeine, avoided sweets but his blood pressure would not go down.  Amlodipine 10 mg daily was stopped due to swelling and HCTZ 12.5 mg was started by Dr. Tamala Julian in the beginning of November.  Patient had labs at PCP, but cannot see them in [facility]. I called office and asked them to fax me the labs. I have not yet received them and it now appears office is closed.

## 2022-08-01 NOTE — Telephone Encounter (Signed)
Received labs from PCP. Sr 0.9, Na 144, K 3.9. Spoke with ptt who said that his BP his AM was 113/82. He lost his pet of 14 years on Sunday and thinks that might be what drove it up. He would like to wait until he sees me 1/29 before we increase HCTZ. I advised that was a fine plan. Continue to check 1-2 times a day. Will evaluate # at apt

## 2022-08-20 ENCOUNTER — Ambulatory Visit: Payer: No Typology Code available for payment source | Attending: Interventional Cardiology

## 2022-08-20 ENCOUNTER — Ambulatory Visit: Payer: No Typology Code available for payment source | Attending: Cardiology | Admitting: Pharmacist

## 2022-08-20 VITALS — BP 130/80 | HR 78

## 2022-08-20 DIAGNOSIS — I10 Essential (primary) hypertension: Secondary | ICD-10-CM | POA: Diagnosis not present

## 2022-08-20 DIAGNOSIS — E7849 Other hyperlipidemia: Secondary | ICD-10-CM

## 2022-08-20 NOTE — Progress Notes (Unsigned)
Patient ID: Devin Lynn                 DOB: [birth year]                      MRN: [id number]     HPI: Devin Lynn is a 67 y.o. male referred by Dr. Tamala Julian to HTN clinic. PMH is significant for prior abnormal nuclear study suggesting high risk, coronary angiography performed in 2015 demonstrating normal coronary arteries, left ventricular size, and function, hypertension, and family history of CAD.  Has had atypical chest pain in the past which led to the aforementioned work-up. Coronary CTA preformed in July 2022 showed no flow limiting lesions.   Patient was seen in HTN clinic on 07/04/21. His blood pressure was 156/90. He was encouraged to resume running/exercising and amlodipine 2.5mg  daily was started. At last visit on 07/26/21 blood pressure was improved at 132/86. Amlodipine was increased to 5mg  daily. Patient expressed a desire to lower lisinopril dose. Discussed the potential to do this depending on his response to amlodipine titration. His home cuff was found to be fairly accurate- about 6 points high for systolic. Blood pressure 134/82 still above goal at 08/25/21 PharmD visit but home readings at goal < 130/80. Continued on lisinopril 30 mg and amlodipine 5 mg daily.  At appointment on 11/14/21, home blood pressures were in the 130's/70's mainly. Amlodipine was increased to 10mg  daily and patient was encouraged to add resistance training into his exercise routine. I last saw patient in clinic 5/17. His blood pressure was well controlled and patient was very excited. He saw Dr. Tamala Julian 05/28/22. Was having some LEE so amlodipine was d/c and HCTZ 12.5mg  daily was started. Follow up BMP was stable.   Pain and hypertension clinic today.  Reports sometimes swelling in his hands that improves with elevation of his feet.  Dizziness occasionally while up walking around.  He is doing a combination of walking and running for about 3 miles daily.  Drinking only water.  Concerned about a family member who he thinks  may be an alcoholic which is causing him some stress.  Thinking about retiring in the next few years.  Bought a new blood pressure cuff since we last saw him. He asks about a nuclear stress test, denies any chest pain or shortness of breath.  Advised that he could speak with his new cardiologist about this but I did not feel as though stress test would be indicated.  Briefly discussed calcium scoring however patient is already on a statin, I do not feel as though this would provide any additional benefit and would probably cause more fear and anything.   Current HTN meds:  Lisinopril 30mg  daily (PM - midnight) HCTZ 12.5mg  daily  Previously tried: Amlodipine 10mg  daily (LEE) BP goal: <130/80  Family History:  The patient's family history includes Diabetes Mellitus II in his father, mother, and sister; HIV/AIDS in his brother.    Social History:   The patient reports that he has never smoked. He has never used smokeless tobacco. He reports that he does not drink alcohol and does not use drugs.   Diet: no coffee, water only, does not eat meat, does eat fish. Says he has a slice or two of pizza on occasion.   Exercise: running/walking 3 miles every day, situps  Home BP readings: 113/82, 117/82, 125/84, 133/81, 122/84, 122/84  Wt Readings from Last 3 Encounters:  05/28/22 179 lb 8 oz (81.4 kg)  10/12/19 180 lb (81.6 kg)  02/12/18 179 lb (81.2 kg)   BP Readings from Last 3 Encounters:  05/28/22 136/84  12/06/21 128/74  11/14/21 140/80   Pulse Readings from Last 3 Encounters:  05/28/22 64  12/06/21 68  11/14/21 64    Renal function: CrCl cannot be calculated (Patient's most recent lab result is older than the maximum 21 days allowed.).  Past Medical History:  Diagnosis Date   Hypertension    Inguinal hernia, left     Current Outpatient Medications on File Prior to Visit  Medication Sig Dispense Refill   aspirin 81 MG tablet Take 81 mg by mouth daily.     hydrochlorothiazide  (MICROZIDE) 12.5 MG capsule Take 1 capsule (12.5 mg total) by mouth daily. 90 capsule 3   lisinopril (ZESTRIL) 20 MG tablet Take 30 mg by mouth daily.     rosuvastatin (CRESTOR) 5 MG tablet Take 1 tablet (5 mg total) by mouth daily. 90 tablet 3   No current facility-administered medications on file prior to visit.    Allergies  Allergen Reactions   Amlodipine Besylate     Edema     Assessment/Plan:  1. Hypertension - Blood pressure is at very close to goal of <130/80 in clinic.  Home blood pressures systolic is at goal diastolic is just above goal.  Have not validated home cuff.  Patient has some dizziness and a mild cough with lisinopril.  He does not wish to change lisinopril at this time, states it is not that bothersome.  I have asked patient to continue to monitor his blood pressure as I am hesitant to increase either of these medications at this time.  Continue lisinopril 30 mg daily and HCTZ 12.5 mg daily.  I have encouraged patient to add in resistance training to his exercise regimen.  He is to call me in a few weeks with more blood pressure readings.     Thank you,  Ramond Dial, Pharm.D, BCPS, CPP [place]  [address], [place], [place] [address]  Phone: [phone]; Fax: [phone]

## 2022-08-21 LAB — LIPID PANEL
Chol/HDL Ratio: 2.3 ratio (ref 0.0–5.0)
Cholesterol, Total: 105 mg/dL (ref 100–199)
HDL: 46 mg/dL (ref >39)
LDL Chol Calc (NIH): 46 mg/dL (ref 0–99)
Triglycerides: 53 mg/dL (ref 0–149)
VLDL Cholesterol Cal: 13 mg/dL (ref 5–40)

## 2022-08-21 LAB — COMPREHENSIVE METABOLIC PANEL
ALT: 47 IU/L — ABNORMAL HIGH (ref 0–44)
AST: 28 IU/L (ref 0–40)
Albumin/Globulin Ratio: 1.7 (ref 1.2–2.2)
Albumin: 4.8 g/dL (ref 3.9–4.9)
Alkaline Phosphatase: 69 IU/L (ref 44–121)
BUN/Creatinine Ratio: 10 (ref 10–24)
BUN: 12 mg/dL (ref 8–27)
Bilirubin Total: 0.6 mg/dL (ref 0.0–1.2)
CO2: 25 mmol/L (ref 20–29)
Calcium: 9.8 mg/dL (ref 8.6–10.2)
Chloride: 105 mmol/L (ref 96–106)
Creatinine, Ser: 1.15 mg/dL (ref 0.76–1.27)
Globulin, Total: 2.8 g/dL (ref 1.5–4.5)
Glucose: 113 mg/dL — ABNORMAL HIGH (ref 70–99)
Potassium: 4.5 mmol/L (ref 3.5–5.2)
Sodium: 142 mmol/L (ref 134–144)
Total Protein: 7.6 g/dL (ref 6.0–8.5)
eGFR: 70 mL/min/{1.73_m2} (ref >59)

## 2022-08-23 ENCOUNTER — Telehealth: Payer: Self-pay | Admitting: Pharmacist

## 2022-08-23 NOTE — Telephone Encounter (Signed)
Patient left a piece of mail here with some notes written on it at last apt. I have tried to call patient's cell with no answer. The day of the apt I called pt home and spoke with his wife and let her know he left something here and I would call pt call. I called his cell but no answer and I could not leave a VM.

## 2022-08-23 NOTE — Telephone Encounter (Signed)
Patient called back. I will put envelope in the mail to send back to him.

## 2022-10-03 ENCOUNTER — Telehealth: Payer: Self-pay | Admitting: Pharmacist

## 2022-10-03 NOTE — Telephone Encounter (Signed)
Called pt to f/u on BP. No answer and no VM on both home and cell

## 2022-10-09 NOTE — Telephone Encounter (Signed)
Patient returned call. He states that he hasn't been checking BP regularly but when he has it is good. States that he gets dizzy sometimes. He would like to schedule a f.u apt with me. He wants to check his BP more consistently and track when he is feeling dizzy before any changes are made. F/U scheduled for 4/26.

## 2022-11-16 ENCOUNTER — Ambulatory Visit: Payer: 59

## 2022-12-18 ENCOUNTER — Ambulatory Visit: Payer: 59 | Attending: Internal Medicine

## 2022-12-18 NOTE — Progress Notes (Deleted)
Patient ID: Devin Lynn                 DOB: 1955/08/05                      MRN: [id number]     HPI: Devin Lynn is a 67 y.o. male referred by Dr. Katrinka Blazing to HTN clinic. PMH is significant for prior abnormal nuclear study suggesting high risk, coronary angiography performed in 2015 demonstrating normal coronary arteries, left ventricular size, and function, hypertension, and family history of CAD.  Has had atypical chest pain in the past which led to the aforementioned work-up. Coronary CTA preformed in July 2022 showed no flow limiting lesions.   Patient was seen in HTN clinic on 07/04/21. His blood pressure was 156/90. He was encouraged to resume running/exercising and amlodipine 2.5mg  daily was started. At last visit on 07/26/21 blood pressure was improved at 132/86. Amlodipine was increased to 5mg  daily. Patient expressed a desire to lower lisinopril dose. Discussed the potential to do this depending on his response to amlodipine titration. His home cuff was found to be fairly accurate- about 6 points high for systolic. Blood pressure 134/82 still above goal at 08/25/21 PharmD visit but home readings at goal < 130/80. Continued on lisinopril 30 mg and amlodipine 5 mg daily.  At appointment on 11/14/21, home blood pressures were in the 130's/70's mainly. Amlodipine was increased to 10mg  daily and patient was encouraged to add resistance training into his exercise routine. I last saw patient in clinic 5/17. His blood pressure was well controlled and patient was very excited. He saw Dr. Katrinka Blazing 05/28/22. Was having some LEE so amlodipine was d/c and HCTZ 12.5mg  daily was started. Follow up BMP was stable.   At last visit with PharmD on 10/03/22 Blood pressure was 130/80. No changes were made. I follow up with patient a few weeks later. He reported some dizziness. Wanted to checked his BP for several weeks and then follow up in office.  Dizziness, lightheadedness, headache, blurred vision, SOB, swelling Weight  training? Sleep Home cuff  Pain and hypertension clinic today.  Reports sometimes swelling in his hands that improves with elevation of his feet.  Dizziness occasionally while up walking around.  He is doing a combination of walking and running for about 3 miles daily.  Drinking only water.  Concerned about a family member who he thinks may be an alcoholic which is causing him some stress.  Thinking about retiring in the next few years.  Bought a new blood pressure cuff since we last saw him. He asks about a nuclear stress test, denies any chest pain or shortness of breath.  Advised that he could speak with his new cardiologist about this but I did not feel as though stress test would be indicated.  Briefly discussed calcium scoring however patient is already on a statin, I do not feel as though this would provide any additional benefit and would probably cause more fear and anything.   Current HTN meds:  Lisinopril 30mg  daily (PM - midnight) HCTZ 12.5mg  daily  Previously tried: Amlodipine 10mg  daily (LEE) BP goal: <130/80  Family History:  The patient's family history includes Diabetes Mellitus II in his father, mother, and sister; HIV/AIDS in his brother.    Social History:   The patient reports that he has never smoked. He has never used smokeless tobacco. He reports that he does not drink alcohol and does not use drugs.   Diet:  no coffee, water only, does not eat meat, does eat fish. Says he has a slice or two of pizza on occasion.   Exercise: running/walking 3 miles every day, situps  Home BP readings:   Wt Readings from Last 3 Encounters:  05/28/22 179 lb 8 oz (81.4 kg)  10/12/19 180 lb (81.6 kg)  02/12/18 179 lb (81.2 kg)   BP Readings from Last 3 Encounters:  08/20/22 130/80  05/28/22 136/84  12/06/21 128/74   Pulse Readings from Last 3 Encounters:  08/20/22 78  05/28/22 64  12/06/21 68    Renal function: CrCl cannot be calculated (Patient's most recent lab result  is older than the maximum 21 days allowed.).  Past Medical History:  Diagnosis Date   Hypertension    Inguinal hernia, left     Current Outpatient Medications on File Prior to Visit  Medication Sig Dispense Refill   aspirin 81 MG tablet Take 81 mg by mouth daily.     hydrochlorothiazide (MICROZIDE) 12.5 MG capsule Take 1 capsule (12.5 mg total) by mouth daily. 90 capsule 3   lisinopril (ZESTRIL) 20 MG tablet Take 30 mg by mouth daily.     rosuvastatin (CRESTOR) 5 MG tablet Take 1 tablet (5 mg total) by mouth daily. 90 tablet 3   No current facility-administered medications on file prior to visit.    Allergies  Allergen Reactions   Amlodipine Besylate     Edema     Assessment/Plan:  1. Hypertension - Blood pressure is at very close to goal of <130/80 in clinic.  Home blood pressures systolic is at goal diastolic is just above goal.  Have not validated home cuff.  Patient has some dizziness and a mild cough with lisinopril.  He does not wish to change lisinopril at this time, states it is not that bothersome.  I have asked patient to continue to monitor his blood pressure as I am hesitant to increase either of these medications at this time.  Continue lisinopril 30 mg daily and HCTZ 12.5 mg daily.  I have encouraged patient to add in resistance training to his exercise regimen.  He is to call me in a few weeks with more blood pressure readings.     Thank you,  Olene Floss, Pharm.D, BCPS, CPP [facility]  [address] [place], [place] [address]  Phone: [phone]; Fax: [phone]

## 2022-12-19 ENCOUNTER — Encounter: Payer: Self-pay | Admitting: Internal Medicine

## 2023-05-15 LAB — LAB REPORT - SCANNED
A1c: 5.6
EGFR: 70

## 2023-05-30 ENCOUNTER — Other Ambulatory Visit: Payer: Self-pay

## 2023-05-30 MED ORDER — HYDROCHLOROTHIAZIDE 12.5 MG PO CAPS: 12.5000 mg | ORAL_CAPSULE | Freq: Every day | ORAL | 0 refills | Status: DC

## 2023-05-30 MED ORDER — ROSUVASTATIN CALCIUM 5 MG PO TABS: 5.0000 mg | ORAL_TABLET | Freq: Every day | ORAL | 0 refills | Status: DC

## 2023-05-30 NOTE — Addendum Note (Signed)
Addended by: Margaret Pyle D on: 05/30/2023 02:30 PM   Modules accepted: Orders

## 2023-06-29 ENCOUNTER — Other Ambulatory Visit: Payer: Self-pay | Admitting: Physician Assistant

## 2023-07-17 ENCOUNTER — Other Ambulatory Visit: Payer: Self-pay | Admitting: Physician Assistant

## 2023-08-02 ENCOUNTER — Other Ambulatory Visit: Payer: Self-pay | Admitting: Physician Assistant

## 2023-08-02 NOTE — Telephone Encounter (Signed)
 This pt is passed his 3rd attempt. Does Tereso Newcomer PA want to refill? Please advise

## 2023-08-07 ENCOUNTER — Ambulatory Visit: Payer: 59 | Attending: Cardiology | Admitting: Cardiology

## 2023-08-07 ENCOUNTER — Encounter: Payer: Self-pay | Admitting: Cardiology

## 2023-08-07 VITALS — BP 134/96 | HR 57 | Ht 69.0 in

## 2023-08-07 DIAGNOSIS — R079 Chest pain, unspecified: Secondary | ICD-10-CM | POA: Diagnosis not present

## 2023-08-07 DIAGNOSIS — E7849 Other hyperlipidemia: Secondary | ICD-10-CM | POA: Diagnosis not present

## 2023-08-07 DIAGNOSIS — I2510 Atherosclerotic heart disease of native coronary artery without angina pectoris: Secondary | ICD-10-CM

## 2023-08-07 DIAGNOSIS — I10 Essential (primary) hypertension: Secondary | ICD-10-CM | POA: Diagnosis not present

## 2023-08-07 NOTE — Patient Instructions (Signed)
 Medication Instructions:  Your physician recommends that you continue on your current medications as directed. Please refer to the Current Medication list given to you today.  *If you need a refill on your cardiac medications before your next appointment, please call your pharmacy*  Lab Work: None ordered today. If you have labs (blood work) drawn today and your tests are completely normal, you will receive your results only by: MyChart Message (if you have MyChart) OR A paper copy in the mail If you have any lab test that is abnormal or we need to change your treatment, we will call you to review the results.  Testing/Procedures: None ordered today.  Follow-Up: At [facility], you and your health needs are our priority.  As part of our continuing mission to provide you with exceptional heart care, we have created designated Provider Care Teams.  These Care Teams include your primary Cardiologist (physician) and Advanced Practice Providers (APPs -  Physician Assistants and Nurse Practitioners) who all work together to provide you with the care you need, when you need it.  We recommend signing up for the patient portal called "MyChart".  Sign up information is provided on this After Visit Summary.  MyChart is used to connect with patients for Virtual Visits (Telemedicine).  Patients are able to view lab/test results, encounter notes, upcoming appointments, etc.  Non-urgent messages can be sent to your provider as well.   To learn more about what you can do with MyChart, go to [email or web address].    Your next appointment:   1 year(s)  The format for your next appointment:   In Person  Provider:   Dorothye Gathers, MD {

## 2023-08-07 NOTE — Progress Notes (Signed)
 Cardiology Office Note:  .   Date:  08/07/2023  ID:  Devin Lynn, DOB 1956-03-31, MRN [id number] PCP: Edda Goo, MD  [facility] Providers Cardiologist:  Dorothye Gathers, MD     History of Present Illness: .   Devin Lynn is a 68 y.o. male Discussed with the use of AI scribe   History of Present Illness   The 68 year old patient with a history of hypertension and mild nonobstructive coronary artery disease presented for a follow-up visit. The patient's coronary artery disease was diagnosed following a nuclear stress test and coronary angiography in 2015, which showed normal coronary arteries. A coronary CT in 2022 revealed a coronary calcium  score of 268 (86th percentile) and mild atherosclerosis. The patient reported occasional dizziness, which he attributed to his hypertension medication, Lisinopril . Consequently, the patient self-adjusted the Lisinopril  dosage from 30mg  to 20mg , which seemed to alleviate the dizziness. The patient also takes Rosuvastatin  (Crestor ) and Hydrochlorothiazide , with an LDL level well controlled at 46. The patient acknowledged the need for more frequent home blood pressure monitoring. The patient leads an active lifestyle, including regular walking for exercise, and has recently committed to drinking only water. The patient expressed concern about the risk of cardiac events despite controlled blood pressure and cholesterol levels. 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          ROS: No CP, no SOB  Studies Reviewed: Aaron Aas   EKG Interpretation Date/Time:  Wednesday August 07 2023 11:40:31 EST Ventricular Rate:  57 PR Interval:  202 QRS Duration:  122 QT Interval:  402 QTC Calculation: 391 R Axis:   -47  Text Interpretation: Sinus bradycardia Left anterior fascicular block Left ventricular hypertrophy with QRS widening ( R in aVL , Cornell product ) Nonspecific ST and T wave abnormality When compared with ECG of 14-Apr-2014 11:48, Inverted T waves have  replaced nonspecific T wave abnormality in Inferior leads Confirmed by Dorothye Gathers ([id number]) on 08/07/2023 11:42:55 AM    Results   LABS LDL: 46  RADIOLOGY Coronary CT: Coronary calcium  score of 268, 86th percentile, mild atherosclerosis, nonobstructive disease (01/2021)  DIAGNOSTIC Coronary angiography: Normal coronary arteries (2015)     Risk Assessment/Calculations:           Physical Exam:   VS:  BP (!) 134/96 (BP Location: Left Arm, Patient Position: Sitting, Cuff Size: Large)   Pulse (!) 57   Ht 5\' 9"  (1.753 m)   SpO2 99%   BMI 26.51 kg/m    Wt Readings from Last 3 Encounters:  05/28/22 179 lb 8 oz (81.4 kg)  10/12/19 180 lb (81.6 kg)  02/12/18 179 lb (81.2 kg)    GEN: Well nourished, well developed in no acute distress NECK: No JVD; No carotid bruits CARDIAC: RRR, no murmurs, no rubs, no gallops RESPIRATORY:  Clear to auscultation without rales, wheezing or rhonchi  ABDOMEN: Soft, non-tender, non-distended EXTREMITIES:  No edema; No deformity   ASSESSMENT AND PLAN: .    Assessment and Plan    Nonobstructive Coronary Artery Disease Nonobstructive coronary artery disease confirmed by coronary CT in July 2022 (calcium  score 268, 86th percentile) and mild atherosclerosis. Previous coronary angiography in 2015 showed normal coronary arteries. Currently on rosuvastatin  (Crestor ) with LDL controlled at 46. No need for stenting or invasive procedures. Crestor  helps stabilize plaque and reduce cardiovascular event risk. - Continue rosuvastatin  (Crestor ) - Schedule a one-year follow-up  Hypertension Hypertension managed with lisinopril  20 mg and hydrochlorothiazide  12.5 mg. Self-reduced lisinopril  from 30 mg  to 20 mg due to dizziness. Elevated blood pressure during visit; not regularly monitored at home. Discussed importance of home monitoring and potential future medication adjustments. - Continue lisinopril  20 mg - Continue hydrochlorothiazide  12.5 mg - Encourage regular  home blood pressure monitoring - Schedule a one-year follow-up  General Health Maintenance Lifestyle changes include drinking only water since January 1st and 30 minutes of daily walking, beneficial for cardiovascular health and blood pressure management. - Encourage continuation of current lifestyle modifications - Reinforce the importance of regular exercise and hydration  Follow-up - Schedule a one-year follow-up appointment.               Signed, Dorothye Gathers, MD

## 2023-09-09 ENCOUNTER — Telehealth: Payer: Self-pay | Admitting: Cardiology

## 2023-09-09 MED ORDER — HYDROCHLOROTHIAZIDE 12.5 MG PO CAPS: 12.5000 mg | ORAL_CAPSULE | Freq: Every day | ORAL | 3 refills | Status: AC

## 2023-09-09 MED ORDER — LISINOPRIL 20 MG PO TABS: 30.0000 mg | ORAL_TABLET | Freq: Every day | ORAL | 3 refills | Status: AC

## 2023-09-09 MED ORDER — ROSUVASTATIN CALCIUM 5 MG PO TABS: 5.0000 mg | ORAL_TABLET | Freq: Every day | ORAL | 3 refills | Status: AC

## 2023-09-09 NOTE — Telephone Encounter (Signed)
*  STAT* If patient is at the pharmacy, call can be transferred to refill team.   1. Which medications need to be refilled? (please list name of each medication and dose if known) hydrochlorothiazide (MICROZIDE) 12.5 MG capsule   lisinopril (ZESTRIL) 20 MG tablet   rosuvastatin (CRESTOR) 5 MG tablet   2. Which pharmacy/location (including street and city if local pharmacy) is medication to be sent to? [id number] - [place], [place] - [address]   3. Do they need a 30 day or 90 day supply? 90
